# Patient Record
Sex: Female | Born: 1970 | Race: Black or African American | Hispanic: No | Marital: Single | Smoking: Never smoker
Health system: Southern US, Community
[De-identification: ages and names within clinical notes are randomized; demographics above are authoritative.]

## PROBLEM LIST (undated history)

## (undated) DIAGNOSIS — I10 Essential (primary) hypertension: Secondary | ICD-10-CM

## (undated) DIAGNOSIS — T8859XA Other complications of anesthesia, initial encounter: Secondary | ICD-10-CM

## (undated) DIAGNOSIS — N808 Other endometriosis: Secondary | ICD-10-CM

## (undated) DIAGNOSIS — T4145XA Adverse effect of unspecified anesthetic, initial encounter: Secondary | ICD-10-CM

## (undated) HISTORY — DX: Essential (primary) hypertension: I10

## (undated) HISTORY — PX: ABDOMINAL HYSTERECTOMY: SHX81

---

## 2010-06-30 ENCOUNTER — Inpatient Hospital Stay (INDEPENDENT_AMBULATORY_CARE_PROVIDER_SITE_OTHER)
Admission: RE | Admit: 2010-06-30 | Discharge: 2010-06-30 | Disposition: A | Discharge status: Home / Self Care | Payer: Self-pay | Source: Ambulatory Visit | Attending: Family Medicine | Admitting: Family Medicine

## 2010-06-30 DIAGNOSIS — R6889 Other general symptoms and signs: Secondary | ICD-10-CM

## 2010-06-30 DIAGNOSIS — N898 Other specified noninflammatory disorders of vagina: Secondary | ICD-10-CM

## 2010-06-30 DIAGNOSIS — M2180 Other specified acquired deformities of unspecified limb: Secondary | ICD-10-CM

## 2010-06-30 LAB — WET PREP, GENITAL: Trich, Wet Prep: NONE SEEN

## 2010-07-01 LAB — GC/CHLAMYDIA PROBE AMP, GENITAL
Chlamydia, DNA Probe: NEGATIVE
GC Probe Amp, Genital: NEGATIVE

## 2010-07-22 ENCOUNTER — Emergency Department (HOSPITAL_COMMUNITY)
Admission: EM | Admit: 2010-07-22 | Discharge: 2010-07-22 | Disposition: A | Discharge status: Home / Self Care | Payer: Medicaid Other | Attending: Emergency Medicine | Admitting: Emergency Medicine

## 2010-07-22 DIAGNOSIS — L723 Sebaceous cyst: Secondary | ICD-10-CM | POA: Insufficient documentation

## 2010-07-22 DIAGNOSIS — R1033 Periumbilical pain: Secondary | ICD-10-CM | POA: Insufficient documentation

## 2010-07-22 LAB — URINALYSIS, ROUTINE W REFLEX MICROSCOPIC
Glucose, UA: NEGATIVE mg/dL
Ketones, ur: NEGATIVE mg/dL
Leukocytes, UA: NEGATIVE
Nitrite: NEGATIVE
Protein, ur: NEGATIVE mg/dL
Urobilinogen, UA: 0.2 mg/dL (ref 0.0–1.0)

## 2010-07-22 LAB — BASIC METABOLIC PANEL
BUN: 12 mg/dL (ref 6–23)
CO2: 26 mEq/L (ref 19–32)
Chloride: 100 mEq/L (ref 96–112)
Creatinine, Ser: 0.53 mg/dL (ref 0.4–1.2)
Potassium: 3.4 mEq/L — ABNORMAL LOW (ref 3.5–5.1)

## 2010-07-22 LAB — CBC
Hemoglobin: 13.5 g/dL (ref 12.0–15.0)
MCH: 28 pg (ref 26.0–34.0)
MCHC: 35.2 g/dL (ref 30.0–36.0)
Platelets: 142 10*3/uL — ABNORMAL LOW (ref 150–400)
RDW: 14.1 % (ref 11.5–15.5)

## 2010-07-22 LAB — PREGNANCY, URINE: Preg Test, Ur: NEGATIVE

## 2010-07-22 LAB — URINE MICROSCOPIC-ADD ON

## 2010-09-07 ENCOUNTER — Emergency Department (HOSPITAL_COMMUNITY)
Admission: EM | Admit: 2010-09-07 | Discharge: 2010-09-07 | Disposition: A | Discharge status: Home / Self Care | Payer: Medicaid Other | Attending: Emergency Medicine | Admitting: Emergency Medicine

## 2010-09-07 ENCOUNTER — Encounter (HOSPITAL_COMMUNITY): Payer: Self-pay

## 2010-09-07 ENCOUNTER — Emergency Department (HOSPITAL_COMMUNITY): Payer: Medicaid Other

## 2010-09-07 ENCOUNTER — Inpatient Hospital Stay (INDEPENDENT_AMBULATORY_CARE_PROVIDER_SITE_OTHER)
Admission: RE | Admit: 2010-09-07 | Discharge: 2010-09-07 | Disposition: A | Discharge status: Home / Self Care | Payer: Self-pay | Source: Ambulatory Visit | Attending: Emergency Medicine | Admitting: Emergency Medicine

## 2010-09-07 DIAGNOSIS — R10819 Abdominal tenderness, unspecified site: Secondary | ICD-10-CM

## 2010-09-07 DIAGNOSIS — L02219 Cutaneous abscess of trunk, unspecified: Secondary | ICD-10-CM | POA: Insufficient documentation

## 2010-09-07 DIAGNOSIS — R1033 Periumbilical pain: Secondary | ICD-10-CM | POA: Insufficient documentation

## 2010-09-07 DIAGNOSIS — R109 Unspecified abdominal pain: Secondary | ICD-10-CM | POA: Insufficient documentation

## 2010-09-07 LAB — COMPREHENSIVE METABOLIC PANEL
CO2: 26 mEq/L (ref 19–32)
Calcium: 9.2 mg/dL (ref 8.4–10.5)
Creatinine, Ser: <0.47 mg/dL — ABNORMAL LOW (ref 0.50–1.10)
Glucose, Bld: 79 mg/dL (ref 70–99)
Total Protein: 8.1 g/dL (ref 6.0–8.3)

## 2010-09-07 LAB — POCT URINALYSIS DIP (DEVICE)
Glucose, UA: NEGATIVE mg/dL
Nitrite: NEGATIVE
Specific Gravity, Urine: 1.015 (ref 1.005–1.030)
Urobilinogen, UA: 0.2 mg/dL (ref 0.0–1.0)
pH: 7 (ref 5.0–8.0)

## 2010-09-07 LAB — DIFFERENTIAL
Eosinophils Absolute: 0.2 10*3/uL (ref 0.0–0.7)
Lymphs Abs: 2.7 10*3/uL (ref 0.7–4.0)
Monocytes Absolute: 0.5 10*3/uL (ref 0.1–1.0)
Monocytes Relative: 7 % (ref 3–12)
Neutro Abs: 3.9 10*3/uL (ref 1.7–7.7)
Neutrophils Relative %: 53 % (ref 43–77)

## 2010-09-07 LAB — CBC
Hemoglobin: 14.2 g/dL (ref 12.0–15.0)
MCH: 28.1 pg (ref 26.0–34.0)
MCHC: 35.6 g/dL (ref 30.0–36.0)
MCV: 78.9 fL (ref 78.0–100.0)
RBC: 5.06 MIL/uL (ref 3.87–5.11)

## 2010-09-07 LAB — LIPASE, BLOOD: Lipase: 49 U/L (ref 11–59)

## 2010-09-07 MED ORDER — IOHEXOL 300 MG/ML  SOLN
100.0000 mL | Freq: Once | INTRAMUSCULAR | Status: AC | PRN
  Administered 2010-09-07: 100 mL via INTRAVENOUS

## 2010-09-10 ENCOUNTER — Emergency Department (HOSPITAL_COMMUNITY)
Admission: EM | Admit: 2010-09-10 | Discharge: 2010-09-10 | Disposition: A | Discharge status: Home / Self Care | Payer: Medicaid Other | Attending: Emergency Medicine | Admitting: Emergency Medicine

## 2010-09-10 DIAGNOSIS — N83209 Unspecified ovarian cyst, unspecified side: Secondary | ICD-10-CM | POA: Insufficient documentation

## 2010-09-10 DIAGNOSIS — R1032 Left lower quadrant pain: Secondary | ICD-10-CM | POA: Insufficient documentation

## 2010-09-10 DIAGNOSIS — R509 Fever, unspecified: Secondary | ICD-10-CM | POA: Insufficient documentation

## 2010-09-10 DIAGNOSIS — Z09 Encounter for follow-up examination after completed treatment for conditions other than malignant neoplasm: Secondary | ICD-10-CM | POA: Insufficient documentation

## 2010-09-19 ENCOUNTER — Emergency Department (HOSPITAL_COMMUNITY)
Admission: EM | Admit: 2010-09-19 | Discharge: 2010-09-19 | Disposition: A | Discharge status: Home / Self Care | Payer: Medicaid Other | Attending: Emergency Medicine | Admitting: Emergency Medicine

## 2010-09-19 DIAGNOSIS — K429 Umbilical hernia without obstruction or gangrene: Secondary | ICD-10-CM | POA: Insufficient documentation

## 2010-09-19 DIAGNOSIS — R609 Edema, unspecified: Secondary | ICD-10-CM | POA: Insufficient documentation

## 2010-09-19 DIAGNOSIS — R229 Localized swelling, mass and lump, unspecified: Secondary | ICD-10-CM | POA: Insufficient documentation

## 2010-09-22 ENCOUNTER — Inpatient Hospital Stay (INDEPENDENT_AMBULATORY_CARE_PROVIDER_SITE_OTHER)
Admission: RE | Admit: 2010-09-22 | Discharge: 2010-09-22 | Disposition: A | Discharge status: Home / Self Care | Payer: Medicaid Other | Source: Ambulatory Visit | Attending: Family Medicine | Admitting: Family Medicine

## 2010-09-22 DIAGNOSIS — R10819 Abdominal tenderness, unspecified site: Secondary | ICD-10-CM

## 2010-10-21 ENCOUNTER — Encounter: Payer: Medicaid Other | Admitting: Obstetrics & Gynecology

## 2010-11-26 ENCOUNTER — Encounter: Payer: Medicaid Other | Admitting: Obstetrics and Gynecology

## 2011-01-08 ENCOUNTER — Encounter: Payer: Medicaid Other | Admitting: Advanced Practice Midwife

## 2011-01-08 ENCOUNTER — Ambulatory Visit (INDEPENDENT_AMBULATORY_CARE_PROVIDER_SITE_OTHER): Payer: Medicaid Other | Admitting: Obstetrics & Gynecology

## 2011-01-08 ENCOUNTER — Encounter: Payer: Self-pay | Admitting: Obstetrics & Gynecology

## 2011-01-08 ENCOUNTER — Encounter: Payer: Self-pay | Admitting: Advanced Practice Midwife

## 2011-01-08 VITALS — BP 145/101 | HR 83 | Temp 97.0°F | Ht 59.0 in | Wt 141.8 lb

## 2011-01-08 DIAGNOSIS — I10 Essential (primary) hypertension: Secondary | ICD-10-CM | POA: Insufficient documentation

## 2011-01-08 DIAGNOSIS — N9489 Other specified conditions associated with female genital organs and menstrual cycle: Secondary | ICD-10-CM

## 2011-01-08 DIAGNOSIS — N83209 Unspecified ovarian cyst, unspecified side: Secondary | ICD-10-CM | POA: Insufficient documentation

## 2011-01-08 LAB — CA 125: CA 125: 28.4 U/mL (ref 0.0–30.2)

## 2011-01-08 LAB — COMPREHENSIVE METABOLIC PANEL
AST: 19 U/L (ref 0–37)
Alkaline Phosphatase: 63 U/L (ref 39–117)
Glucose, Bld: 77 mg/dL (ref 70–99)
Sodium: 138 mEq/L (ref 135–145)
Total Bilirubin: 0.6 mg/dL (ref 0.3–1.2)
Total Protein: 7.7 g/dL (ref 6.0–8.3)

## 2011-01-08 LAB — CBC
MCH: 27.8 pg (ref 26.0–34.0)
MCHC: 34.8 g/dL (ref 30.0–36.0)
Platelets: 160 10*3/uL (ref 150–400)
RDW: 14 % (ref 11.5–15.5)

## 2011-01-08 LAB — LACTATE DEHYDROGENASE: LDH: 203 U/L (ref 94–250)

## 2011-01-08 MED ORDER — HYDROCHLOROTHIAZIDE 25 MG PO TABS: 25.0000 mg | ORAL_TABLET | Freq: Every day | ORAL | Status: DC

## 2011-01-08 NOTE — Progress Notes (Signed)
History:  40 y.o. G0P0 here today for follow up of adnexal cyst/pelvic mass seen during an ER encounter for abdominal pain in 08/2010. Patient is here today with a [place] interpreter.  She denies any current pain, bleeding or other GYN concerns.    09/07/10 CT ABDOMEN AND PELVIS WITH CONTRAST  Findings: The lung bases are clear. The solid abdominal organs are normal. The gallbladder is normal. No common bile duct dilatation. The stomach, duodenum, small bowel and colon are unremarkable. The appendix is normal. No mesenteric or retroperitoneal masses or adenopathy. The aorta is normal in caliber. Node dissection. The major branch vessels are normal. A circumaortic left renal vein is noted. There is a soft tissue mass at the umbilicus. This could be an umbilical region hernia. This should be obvious clinically. Recommend clinical correlation. The uterus is surgically absent. There is a large cystic lesion likely associated the left ovary. This measures 8 x 6 x 6.7 cm. The bladder is unremarkable. No pelvic lymphadenopathy. Small left inguinal lymph nodes are noted. The bony structures are unremarkable.  IMPRESSION: 1. 8 cm cystic adnexal lesion. MRI may be helpful for further evaluation. 2. Soft tissue mass at the dome like this is likely a small umbilical hernia.  The following portions of the patient's history were reviewed and updated as appropriate: allergies, current medications, past family history, past medical history, past social history, past surgical history and problem list.   Objective:  Physical Exam Blood pressure 145/101, pulse 83, temperature 97 F (36.1 C), temperature source Oral, height 4\' 11"  (1.499 m), weight 141 lb 12.8 oz (64.32 kg). Gen: NAD Abd: Soft, nontender and nondistended Pelvic: Normal appearing external genitalia.  No palpable masses or adnexal tenderness but patient had voluntary guarding making it difficult to palpate anything.  Assessment & Plan:  Will check ovarian  cancer markers and get MRI for further characterization.  Follow up results and manage accordingly. Analgesia as needed. Patient referred to [facility] for management of HTN; she was presumptively started on HCTZ 25 mg po daily.

## 2011-01-08 NOTE — Patient Instructions (Signed)
Come back to [facility] or call clinic for any concerns.

## 2011-01-08 NOTE — Progress Notes (Signed)
Referral sent to [facility].

## 2011-01-08 NOTE — Progress Notes (Signed)
Used interpreter [employer]

## 2011-01-13 ENCOUNTER — Ambulatory Visit (HOSPITAL_COMMUNITY)
Admission: RE | Admit: 2011-01-13 | Discharge: 2011-01-13 | Disposition: A | Discharge status: Home / Self Care | Payer: Medicaid Other | Source: Ambulatory Visit | Attending: Obstetrics & Gynecology | Admitting: Obstetrics & Gynecology

## 2011-01-13 DIAGNOSIS — N9489 Other specified conditions associated with female genital organs and menstrual cycle: Secondary | ICD-10-CM | POA: Insufficient documentation

## 2011-01-13 MED ORDER — GADOBENATE DIMEGLUMINE 529 MG/ML IV SOLN
13.0000 mL | Freq: Once | INTRAVENOUS | Status: AC
  Administered 2011-01-13: 13 mL via INTRAVENOUS

## 2011-01-18 ENCOUNTER — Telehealth: Payer: Self-pay | Admitting: *Deleted

## 2011-01-18 NOTE — Telephone Encounter (Signed)
Message copied by Jill Side on Mon Jan 18, 2011  4:20 PM ------      Message from: Mayra Neer P      Created: Wed Jan 13, 2011  4:38 PM      Regarding: Call patient with results       Patient speaks [place].  Please call on Monday 01/18/11 with MRI results.  Let her know that MRI shows shrinkage and no immediate need for surgery.  If she would like to schedule an appointment to discuss face-to-face with provider, please schedule.      Thanks,      [employer]

## 2011-01-18 NOTE — Telephone Encounter (Signed)
Contacted pt w/[employer] interpreter # [id number]. I informed her of MRI result and no need for surgery @ this time. I also gave information that repeat ultrasound is recommended to be performed in 1 yr. Pt voiced understanding. I stated that it was not necessary for pt to return to see the doctor but if she wanted, we would make an appt. Pt stated that she would like to see the doctor again.  Appt was given for 01/28/11 @ 1245.  Pt voiced understanding.

## 2011-01-28 ENCOUNTER — Encounter: Payer: Self-pay | Admitting: Obstetrics & Gynecology

## 2011-01-28 ENCOUNTER — Ambulatory Visit (INDEPENDENT_AMBULATORY_CARE_PROVIDER_SITE_OTHER): Payer: Medicaid Other | Admitting: Obstetrics & Gynecology

## 2011-01-28 DIAGNOSIS — K429 Umbilical hernia without obstruction or gangrene: Secondary | ICD-10-CM

## 2011-01-28 DIAGNOSIS — Z90710 Acquired absence of both cervix and uterus: Secondary | ICD-10-CM | POA: Insufficient documentation

## 2011-01-28 NOTE — Progress Notes (Signed)
Pt has already had flu vaccine from school.

## 2011-01-28 NOTE — Progress Notes (Signed)
History:  40 y.o. G0P0 here today for follow up of adnexal cyst/pelvic mass seen during an ER encounter for abdominal pain in 08/2010. Patient is here today with a [place] interpreter.  Patient underwent an MRI on 01/13/11 and is here for follow up results.   01/13/2011  MRI PELVIS WITHOUT AND WITH CONTRAST  Findings: The cystic left ovarian lesion has decreased in volume compared to prior measuring 3.6 x 4.0 x 3.9 cm compared to 8.1 x 5.9 x 7.1 cm.  The lesion has no significant nodularity or septal elements.  There is small amount of hemorrhage or protein which collects dependently within the lesion seen on the noncontrast T1- weighted imaging.  On the postcontrast images, the cystic lesion has no enhancing internal components.  There is a rim of tissues surrounding the lesion which is likely ovarian tissue.  There are several smaller cysts within the elongated left ovary which also appears simple and measure less than 10 mm.  There the right ovary is not clearly identified.  Bladder is normal.  Post hysterectomy anatomy.  No pelvic lymphadenopathy.  The rectum and sigmoid colon appear normal.  No osseous lesions evident.  IMPRESSION:   Interval decrease in size of left adnexal cystic lesion is a reassuring finding.  Lesion has no specific worrisome characteristics.  Due to its large size recommend follow-up pelvic ultrasound in 1 year.   09/07/10 CT ABDOMEN AND PELVIS WITH CONTRAST  Findings: The lung bases are clear. The solid abdominal organs are normal. The gallbladder is normal. No common bile duct dilatation. The stomach, duodenum, small bowel and colon are unremarkable. The appendix is normal. No mesenteric or retroperitoneal masses or adenopathy. The aorta is normal in caliber. Node dissection. The major branch vessels are normal. A circumaortic left renal vein is noted. There is a soft tissue mass at the umbilicus. This could be an umbilical region hernia. This should be obvious clinically. Recommend  clinical correlation. The uterus is surgically absent. There is a large cystic lesion likely associated the left ovary. This measures 8 x 6 x 6.7 cm. The bladder is unremarkable. No pelvic lymphadenopathy. Small left inguinal lymph nodes are noted. The bony structures are unremarkable.  IMPRESSION: 1. 8 cm cystic adnexal lesion. MRI may be helpful for further evaluation. 2. Soft tissue mass at the dome like this is likely a small umbilical hernia.  Patient was reassured by this results but then reported that she has episodes of vaginal bleeding every 1-2 months that lasts a couple of days.  She said this started a year after her hysterectomy.  She also wants to know if she can have children.  Also, she reports umbilical pain that has been on-and-off for many months with some irritation and bleeding.  No other symptoms.  The following portions of the patient's history were reviewed and updated as appropriate: allergies, current medications, past family history, past medical history, past social history, past surgical history and problem list.   Objective:  Physical Exam Blood pressure 115/81, pulse 88, temperature 96.8 F (36 C), temperature source Oral, height 5\' 2"  (1.575 m), weight 139 lb 9.6 oz (63.322 kg). Gen: NAD Abd: Soft, about 2 cm umbilical hernia noted with some tenderness on palpation, superficial excoriated lesion noted on surface, no active drainage, bleeding or erythema Pelvic: Normal appearing external genitalia. On speculum exam, no cervix visualized and no vaginal lesions seen.  Well healed vaginal cuff.  On bimanual exam, no palpable cervix, masses or adnexal tenderness.  Recent Results (from the past 672 hour(s))  CA 125   Collection Time   01/08/11  9:25 AM      Component Value Range   CA 125 28.4  0.0 - 30.2 (U/mL)  LACTATE DEHYDROGENASE   Collection Time   01/08/11  9:25 AM      Component Value Range   LD 203  94 - 250 (U/L)  HCG, TUMOR MARKER   Collection Time    01/08/11  9:25 AM      Component Value Range   Beta hCG, Tumor Marker < 0.5  < 5.0 (mIU/mL)  CBC   Collection Time   01/08/11  9:25 AM      Component Value Range   WBC 4.9  4.0 - 10.5 (K/uL)   RBC 4.93  3.87 - 5.11 (MIL/uL)   Hemoglobin 13.7  12.0 - 15.0 (g/dL)   HCT 40.9  81.1 - 91.4 (%)   MCV 79.9  78.0 - 100.0 (fL)   MCH 27.8  26.0 - 34.0 (pg)   MCHC 34.8  30.0 - 36.0 (g/dL)   RDW 78.2  95.6 - 21.3 (%)   Platelets 160  150 - 400 (K/uL)  COMPREHENSIVE METABOLIC PANEL   Collection Time   01/08/11  9:25 AM      Component Value Range   Sodium 138  135 - 145 (mEq/L)   Potassium 3.9  3.5 - 5.3 (mEq/L)   Chloride 103  96 - 112 (mEq/L)   CO2 23  19 - 32 (mEq/L)   Glucose, Bld 77  70 - 99 (mg/dL)   BUN 9  6 - 23 (mg/dL)   Creat 0.86  5.78 - 4.69 (mg/dL)   Total Bilirubin 0.6  0.3 - 1.2 (mg/dL)   Alkaline Phosphatase 63  39 - 117 (U/L)   AST 19  0 - 37 (U/L)   ALT 15  0 - 35 (U/L)   Total Protein 7.7  6.0 - 8.3 (g/dL)   Albumin 4.1  3.5 - 5.2 (g/dL)   Calcium 9.5  8.4 - 62.9 (mg/dL)  Ovarian cancer markers: All within normal limits   Assessment & Plan:  Patient reassured about adnexal mass, will reevaluate in one year or earlier  Patient informed that she does not have a cervix or uterus and no bleeding etiology is found.  She was also told she cannot bear any children; but can use a surrogate given that she still has her ovaries.  Patient reported that she was not informed that she could not have more children during her surgery in [place].  She was appropriately saddened by this news, support given.  Patient referred to General Surgery for management of her umbilical hernia.  Return to clinic for any gynecologic concerns or for annual exam.

## 2011-01-28 NOTE — Patient Instructions (Signed)
Return to clinic as needed

## 2011-01-28 NOTE — Progress Notes (Signed)
Referral made to [facility] for umbilical hernia fjor 02/12/11 at 0920 with Dr. Luisa Hart. Patient notified

## 2011-02-08 ENCOUNTER — Inpatient Hospital Stay (HOSPITAL_COMMUNITY): Payer: Medicaid Other

## 2011-02-08 ENCOUNTER — Encounter (HOSPITAL_COMMUNITY): Payer: Self-pay | Admitting: *Deleted

## 2011-02-08 ENCOUNTER — Inpatient Hospital Stay (HOSPITAL_COMMUNITY)
Admission: AD | Admit: 2011-02-08 | Discharge: 2011-02-08 | Disposition: A | Discharge status: Home / Self Care | Payer: Medicaid Other | Source: Ambulatory Visit | Attending: Obstetrics & Gynecology | Admitting: Obstetrics & Gynecology

## 2011-02-08 DIAGNOSIS — Z90710 Acquired absence of both cervix and uterus: Secondary | ICD-10-CM | POA: Insufficient documentation

## 2011-02-08 DIAGNOSIS — N83209 Unspecified ovarian cyst, unspecified side: Secondary | ICD-10-CM | POA: Insufficient documentation

## 2011-02-08 DIAGNOSIS — R109 Unspecified abdominal pain: Secondary | ICD-10-CM | POA: Insufficient documentation

## 2011-02-08 DIAGNOSIS — N949 Unspecified condition associated with female genital organs and menstrual cycle: Secondary | ICD-10-CM | POA: Insufficient documentation

## 2011-02-08 DIAGNOSIS — N898 Other specified noninflammatory disorders of vagina: Secondary | ICD-10-CM | POA: Insufficient documentation

## 2011-02-08 LAB — CBC
HCT: 37.6 % (ref 36.0–46.0)
Platelets: 153 10*3/uL (ref 150–400)
RBC: 4.65 MIL/uL (ref 3.87–5.11)
RDW: 13.9 % (ref 11.5–15.5)
WBC: 5.5 10*3/uL (ref 4.0–10.5)

## 2011-02-08 LAB — URINALYSIS, ROUTINE W REFLEX MICROSCOPIC
Bilirubin Urine: NEGATIVE
Glucose, UA: NEGATIVE mg/dL
Hgb urine dipstick: NEGATIVE
Ketones, ur: NEGATIVE mg/dL
Leukocytes, UA: NEGATIVE
Nitrite: NEGATIVE
Protein, ur: NEGATIVE mg/dL
Specific Gravity, Urine: 1.01 (ref 1.005–1.030)
Urobilinogen, UA: 0.2 mg/dL (ref 0.0–1.0)
pH: 7 (ref 5.0–8.0)

## 2011-02-08 LAB — DIFFERENTIAL
Basophils Absolute: 0 10*3/uL (ref 0.0–0.1)
Lymphocytes Relative: 39 % (ref 12–46)
Lymphs Abs: 2.2 10*3/uL (ref 0.7–4.0)
Neutro Abs: 2.7 10*3/uL (ref 1.7–7.7)

## 2011-02-08 LAB — WET PREP, GENITAL
Trich, Wet Prep: NONE SEEN
WBC, Wet Prep HPF POC: NONE SEEN
Yeast Wet Prep HPF POC: NONE SEEN

## 2011-02-08 MED ORDER — TRAMADOL-ACETAMINOPHEN 37.5-325 MG PO TABS
1.0000 | ORAL_TABLET | Freq: Four times a day (QID) | ORAL | Status: AC | PRN
Start: 2011-02-08 — End: 2011-02-18

## 2011-02-08 NOTE — ED Notes (Signed)
Dr. Debroah Loop at bedside discussing plan for pt to be reevaluated next month using language line.

## 2011-02-08 NOTE — ED Notes (Signed)
Pt to be seen in the GYN clinic at [facility] on 03-11-11 at 1445 for re evaluation.

## 2011-02-08 NOTE — Progress Notes (Signed)
Pt states suprapubic pain since Saturday, is constant, denies bleeding or vag d/c changes. Hysterectomy 5 years ago. Denies uti s/s.

## 2011-02-08 NOTE — ED Provider Notes (Signed)
History     CSN: [id number] Arrival date & time: 02/08/2011  9:32 AM   None     Chief Complaint  Patient presents with  . Abdominal Pain   HPI Anne [name] is a 40 y.o. female who presents to [facility] for abdominal pain and vaginal bleeding that started 2 days ago. She had a hysterectomy in 2008 but still has her ovaries. She had a mass on her left ovary that has been followed by the GYN Clinic. Had MRI a couple weeks ago. The history was provided by the patient through the [employer].  Past Medical History  Diagnosis Date  . H/O: hysterectomy   . Hypertension 01/08/2011    Past Surgical History  Procedure Date  . Abdominal hysterectomy   . Hernia repair     Family History  Problem Relation Age of Onset  . Anesthesia problems Neg Hx     History  Substance Use Topics  . Smoking status: Never Smoker   . Smokeless tobacco: Never Used  . Alcohol Use: No    OB History    Grav Para Term Preterm Abortions TAB SAB Ect Mult Living   0 0 0 0 0 0 0 0 0 0       Review of Systems  Constitutional: Negative for fever and chills.  HENT: Negative.   Eyes: Negative.   Respiratory: Negative.   Gastrointestinal: Positive for abdominal pain and diarrhea. Negative for nausea, vomiting and constipation.  Genitourinary: Positive for vaginal bleeding and pelvic pain. Negative for dysuria, frequency and vaginal discharge.  Musculoskeletal: Negative for back pain.  Skin: Negative.   Neurological: Negative for dizziness, light-headedness and headaches.  Psychiatric/Behavioral: The patient is not nervous/anxious.     Allergies  Review of patient's allergies indicates no known allergies.  Home Medications  No current outpatient prescriptions on file.  BP 136/88  Pulse 71  Temp(Src) 96.7 F (35.9 C) (Oral)  Resp 16  Ht 4' 11.25" (1.505 m)  Wt 141 lb 8 oz (64.184 kg)  BMI 28.34 kg/m2  Physical Exam  Nursing note and vitals reviewed. Constitutional: She is oriented to  person, place, and time. She appears well-developed and well-nourished.  HENT:  Head: Normocephalic.  Eyes: EOM are normal.  Neck: Neck supple.  Cardiovascular: Normal rate.   Pulmonary/Chest: Effort normal.  Abdominal: Soft. There is tenderness.       Abdominal scars due to hysterectomy and hernia repair. Tender with palpation lower abdomen, left side > right.   Genitourinary:       External genitalia without lesions. Scant blood vaginal vault. Cervix absent, uterus absent. Left adnexal tenderness.  Musculoskeletal:       Left leg much smaller than the right. Patient states due to having a sore there when she was a child that affected the growth of the leg.  Neurological: She is alert and oriented to person, place, and time. No cranial nerve deficit.  Skin: Skin is warm and dry.  Psychiatric: Her behavior is normal. Judgment and thought content normal.   Results for orders placed during the hospital encounter of 02/08/11 (from the past 24 hour(s))  URINALYSIS, ROUTINE W REFLEX MICROSCOPIC     Status: Normal   Collection Time   02/08/11 10:27 AM      Component Value Range   Color, Urine YELLOW  YELLOW    APPearance CLEAR  CLEAR    Specific Gravity, Urine 1.010  1.005 - 1.030    pH 7.0  5.0 - 8.0  Glucose, UA NEGATIVE  NEGATIVE (mg/dL)   Hgb urine dipstick NEGATIVE  NEGATIVE    Bilirubin Urine NEGATIVE  NEGATIVE    Ketones, ur NEGATIVE  NEGATIVE (mg/dL)   Protein, ur NEGATIVE  NEGATIVE (mg/dL)   Urobilinogen, UA 0.2  0.0 - 1.0 (mg/dL)   Nitrite NEGATIVE  NEGATIVE    Leukocytes, UA NEGATIVE  NEGATIVE   WET PREP, GENITAL     Status: Normal   Collection Time   02/08/11  1:20 PM      Component Value Range   Yeast, Wet Prep NONE SEEN  NONE SEEN    Trich, Wet Prep NONE SEEN  NONE SEEN    Clue Cells, Wet Prep NONE SEEN  NONE SEEN    WBC, Wet Prep HPF POC NONE SEEN  NONE SEEN    ED Course  ProceduresUs Transvaginal Non-ob  02/08/2011  *RADIOLOGY REPORT*  Clinical Data: History  of left adnexal mass.  Post hysterectomy 5 years ago.  TRANSABDOMINAL AND TRANSVAGINAL ULTRASOUND OF PELVIS Technique:  Both transabdominal and transvaginal ultrasound examinations of the pelvis were performed. Transabdominal technique was performed for global imaging of the pelvis including uterus, ovaries, adnexal regions, and pelvic cul-de-sac.  Comparison: MRI 12/2010   It was necessary to proceed with endovaginal exam following the transabdominal exam to visualize the vaginal cuff and adnexa.  Findings:  Uterus: Has been surgically removed.  A normal vaginal cuff is seen  Endometrium: Not applicable  Right ovary:  Is not seen with confidence either transabdominally or endovaginally  Left ovary: Measures 5.6 x 2.3 by 2.5 cm and contains several follicles, the largest measuring 1.7 x 1.1 by 1.8 cm. The previously noted cystic lesion associated with the left ovary is no longer visualized.  Other findings: A trace of simple free fluid is noted in the cul-de- sac.  IMPRESSION: Interval resolution of the left ovarian cystic lesion seen by MRI. No worrisome ovarian lesions are identified today.  Normal vaginal cuff.  Original Report Authenticated By: Bertha Stakes, M.D.   US Pelvis Complete  02/08/2011  *RADIOLOGY REPORT*  Clinical Data: History of left adnexal mass.  Post hysterectomy 5 years ago.  TRANSABDOMINAL AND TRANSVAGINAL ULTRASOUND OF PELVIS Technique:  Both transabdominal and transvaginal ultrasound examinations of the pelvis were performed. Transabdominal technique was performed for global imaging of the pelvis including uterus, ovaries, adnexal regions, and pelvic cul-de-sac.  Comparison: MRI 12/2010   It was necessary to proceed with endovaginal exam following the transabdominal exam to visualize the vaginal cuff and adnexa.  Findings:  Uterus: Has been surgically removed.  A normal vaginal cuff is seen  Endometrium: Not applicable  Right ovary:  Is not seen with confidence either  transabdominally or endovaginally  Left ovary: Measures 5.6 x 2.3 by 2.5 cm and contains several follicles, the largest measuring 1.7 x 1.1 by 1.8 cm. The previously noted cystic lesion associated with the left ovary is no longer visualized.  Other findings: A trace of simple free fluid is noted in the cul-de- sac.  IMPRESSION: Interval resolution of the left ovarian cystic lesion seen by MRI. No worrisome ovarian lesions are identified today.  Normal vaginal cuff.  Original Report Authenticated By: Bertha Stakes, M.D.   Assessment: Vaginal bleeding s/p hysterectomy 2008   Pelvic pain   Left ovarian cyst Findings reviewed with patient via interpreter. Will repeat U/S 1 month, Rx Ultracet prn, rtc 1 mo. Plan:  Dr. Debroah Loop in to evaluate the patient.   Discussed results of  ultrasound and follow up plan of care.  ARNOLD,JAMES    MDM          Kerrie Buffalo, NP 02/08/11 1414

## 2011-02-08 NOTE — Plan of Care (Signed)
Interpreter language line ID # [id number] Dr. Debroah Loop had to step out of room, pt states she has dark discharge since 2009 every 2 months since hysterectomy. Will continue when Dr. Debroah Loop back in room

## 2011-02-12 ENCOUNTER — Encounter (INDEPENDENT_AMBULATORY_CARE_PROVIDER_SITE_OTHER): Payer: Self-pay | Admitting: Surgery

## 2011-02-12 ENCOUNTER — Ambulatory Visit (INDEPENDENT_AMBULATORY_CARE_PROVIDER_SITE_OTHER): Payer: Medicaid Other | Admitting: Surgery

## 2011-02-12 VITALS — BP 120/88 | HR 75 | Temp 96.9°F | Resp 18 | Ht 62.0 in | Wt 143.8 lb

## 2011-02-12 DIAGNOSIS — R1900 Intra-abdominal and pelvic swelling, mass and lump, unspecified site: Secondary | ICD-10-CM

## 2011-02-12 NOTE — Progress Notes (Signed)
Patient ID: Anne [name], female   DOB: 1970-03-19, 39 y.o.   MRN: [id number]  Chief Complaint  Patient presents with  . Umbilical Hernia    HPI Anne [name] is a 40 y.o. female. HPIThe patient presents today at the request of Dr. Sharlotte Alamo do to a mass at her umbilicus. It has been present for 9 months. It is not cause pain. [place] has [place] here from [place]. She has an ovarian cyst which is being followed. The mass is not bleed. She speaks little [place] and the remainder of her history is difficult.  Past Medical History  Diagnosis Date  . H/O: hysterectomy   . Hypertension 01/08/2011    Past Surgical History  Procedure Date  . Abdominal hysterectomy   . Hernia repair     Family History  Problem Relation Age of Onset  . Anesthesia problems Neg Hx     Social History History  Substance Use Topics  . Smoking status: Never Smoker   . Smokeless tobacco: Never Used  . Alcohol Use: No    No Known Allergies  Current Outpatient Prescriptions  Medication Sig Dispense Refill  . ibuprofen (ADVIL,MOTRIN) 200 MG tablet Take 400 mg by mouth 2 (two) times daily as needed. Pain        . traMADol-acetaminophen (ULTRACET) 37.5-325 MG per tablet Take 1-2 tablets by mouth every 6 (six) hours as needed for pain.  30 tablet  0    Review of Systems Review of Systems  Constitutional: Negative for fever, chills and unexpected weight change.  HENT: Negative for hearing loss, congestion, sore throat, trouble swallowing and voice change.   Eyes: Negative for visual disturbance.  Respiratory: Negative for cough and wheezing.   Cardiovascular: Negative for chest pain, palpitations and leg swelling.  Gastrointestinal: Negative for nausea, vomiting, abdominal pain, diarrhea, constipation, blood in stool, abdominal distention and anal bleeding.  Genitourinary: Negative for hematuria, vaginal bleeding and difficulty urinating.  Musculoskeletal: Negative for arthralgias.  Skin: Negative for rash  and wound.  Neurological: Negative for seizures, syncope and headaches.  Hematological: Negative for adenopathy. Does not bruise/bleed easily.  Psychiatric/Behavioral: Negative for confusion.    Blood pressure 120/88, pulse 75, temperature 96.9 F (36.1 C), temperature source Temporal, resp. rate 18, height 5\' 2"  (1.575 m), weight 143 lb 12.8 oz (65.227 kg).  Physical Exam Physical Exam  Constitutional: She appears well-developed and well-nourished.  HENT:  Head: Normocephalic and atraumatic.  Eyes: EOM are normal. Pupils are equal, round, and reactive to light.  Neck: Normal range of motion. Neck supple.  Cardiovascular: Normal rate and regular rhythm.   Pulmonary/Chest: Effort normal and breath sounds normal.  Abdominal: Soft.       Umbilical mass measuring 2 cm. No obvious fascial defect.  Skin: Skin is warm.    Data Reviewed CT from July 2012 reviewed mass umbilicus noted. 2 cm in size. Cystic mass of the ovary noted  Assessment    Umbilical mass versus umbilical hernia    Plan    I discussed operative therapies with her today. I do not think she understands what this entails. I will have her return when appropriate language support is available.       Kevron Patella A. 02/12/2011, 10:05 AM

## 2011-02-12 NOTE — Patient Instructions (Signed)
Return in 1 month

## 2011-03-11 ENCOUNTER — Ambulatory Visit: Payer: Medicaid Other | Admitting: Obstetrics & Gynecology

## 2011-03-18 ENCOUNTER — Encounter (INDEPENDENT_AMBULATORY_CARE_PROVIDER_SITE_OTHER): Payer: Self-pay | Admitting: Surgery

## 2011-03-18 ENCOUNTER — Ambulatory Visit (INDEPENDENT_AMBULATORY_CARE_PROVIDER_SITE_OTHER): Payer: Self-pay | Admitting: Surgery

## 2011-03-18 VITALS — BP 126/80 | HR 90 | Temp 98.4°F | Ht 59.84 in | Wt 144.0 lb

## 2011-03-18 DIAGNOSIS — K429 Umbilical hernia without obstruction or gangrene: Secondary | ICD-10-CM

## 2011-03-18 NOTE — Patient Instructions (Signed)
You will be scheduled for surgery.

## 2011-03-18 NOTE — Progress Notes (Signed)
Patient ID: Anne [name], female   DOB: [birth year], 41 y.o.   MRN: [id number]  Chief Complaint  Patient presents with  . Follow-up    reck umb hernia    HPI Anne [name] is a 41 y.o. female. HPIThe patient presents today at the request of Dr. Sharlotte Alamo do to a mass at her umbilicus. It has been present for 9 months. It is not cause pain. [place] has [place] from [place]. She has an ovarian cyst which is being followed. The mass is not bleed. She speaks little [place] and the remainder of her history is difficult. She was seen about a month ago and her symptoms are the same.  Past Medical History  Diagnosis Date  . H/O: hysterectomy   . Hypertension 01/08/2011    Past Surgical History  Procedure Date  . Abdominal hysterectomy   . Hernia repair     Family History  Problem Relation Age of Onset  . Anesthesia problems Neg Hx     Social History History  Substance Use Topics  . Smoking status: Never Smoker   . Smokeless tobacco: Never Used  . Alcohol Use: No    No Known Allergies  Current Outpatient Prescriptions  Medication Sig Dispense Refill  . ibuprofen (ADVIL,MOTRIN) 200 MG tablet Take 200 mg by mouth as needed.        Review of Systems Review of Systems  Constitutional: Negative for fever, chills and unexpected weight change.  HENT: Negative for hearing loss, congestion, sore throat, trouble swallowing and voice change.   Eyes: Negative for visual disturbance.  Respiratory: Negative for cough and wheezing.   Cardiovascular: Negative for chest pain, palpitations and leg swelling.  Gastrointestinal: Negative for nausea, vomiting, abdominal pain, diarrhea, constipation, blood in stool, abdominal distention and anal bleeding.  Genitourinary: Negative for hematuria, vaginal bleeding and difficulty urinating.  Musculoskeletal: Negative for arthralgias.  Skin: Negative for rash and wound.  Neurological: Negative for seizures, syncope and headaches.  Hematological:  Negative for adenopathy. Does not bruise/bleed easily.  Psychiatric/Behavioral: Negative for confusion.    Blood pressure 126/80, pulse 90, temperature 98.4 F (36.9 C), temperature source Temporal, height 4' 11.84" (1.52 m), weight 144 lb (65.318 kg), SpO2 98.00%.  Physical Exam Physical Exam  Constitutional: She appears well-developed and well-nourished.  HENT:  Head: Normocephalic and atraumatic.  Eyes: EOM are normal. Pupils are equal, round, and reactive to light.  Neck: Normal range of motion. Neck supple.  Cardiovascular: Normal rate and regular rhythm.   Pulmonary/Chest: Effort normal and breath sounds normal.  Abdominal: Soft.       Umbilical mass measuring 2 cm. No obvious fascial defect.  Skin: Skin is warm.    Data Reviewed CT from July 2012 reviewed mass umbilicus noted. 2 cm in size. Cystic mass of the ovary noted  Assessment    Umbilical mass versus umbilical hernia    Plan    She returns and I have a translator today explain the surgery,  Risks and alternative therapies.  She would like to proceed.The risk of hernia repair include bleeding,  Infection,   Recurrence of the hernia,  Mesh use, chronic pain,  Organ injury,  Bowel injury,  Bladder injury,   nerve injury with numbness around the incision,  Death,  and worsening of preexisting  medical problems.  The alternatives to surgery have been discussed as well..  Long term expectations of both operative and non operative treatments have been discussed.   The patient agrees to proceed.  Rosio Weiss A. 03/18/2011, 11:37 AM

## 2011-09-02 ENCOUNTER — Encounter (HOSPITAL_COMMUNITY): Payer: Self-pay | Admitting: Nurse Practitioner

## 2011-09-02 ENCOUNTER — Emergency Department (HOSPITAL_COMMUNITY)
Admission: EM | Admit: 2011-09-02 | Discharge: 2011-09-02 | Disposition: A | Discharge status: Home / Self Care | Payer: Self-pay | Attending: Emergency Medicine | Admitting: Emergency Medicine

## 2011-09-02 ENCOUNTER — Emergency Department (HOSPITAL_COMMUNITY): Payer: Self-pay

## 2011-09-02 DIAGNOSIS — L02211 Cutaneous abscess of abdominal wall: Secondary | ICD-10-CM

## 2011-09-02 DIAGNOSIS — L02219 Cutaneous abscess of trunk, unspecified: Secondary | ICD-10-CM

## 2011-09-02 DIAGNOSIS — L03319 Cellulitis of trunk, unspecified: Secondary | ICD-10-CM

## 2011-09-02 DIAGNOSIS — I10 Essential (primary) hypertension: Secondary | ICD-10-CM | POA: Insufficient documentation

## 2011-09-02 DIAGNOSIS — R1033 Periumbilical pain: Secondary | ICD-10-CM | POA: Insufficient documentation

## 2011-09-02 DIAGNOSIS — L0291 Cutaneous abscess, unspecified: Secondary | ICD-10-CM

## 2011-09-02 HISTORY — PX: INCISION AND DRAINAGE ABSCESS: SHX5864

## 2011-09-02 LAB — CBC WITH DIFFERENTIAL/PLATELET
HCT: 38.8 % (ref 36.0–46.0)
Hemoglobin: 13.6 g/dL (ref 12.0–15.0)
Lymphocytes Relative: 36 % (ref 12–46)
Lymphs Abs: 1.7 10*3/uL (ref 0.7–4.0)
MCV: 80.5 fL (ref 78.0–100.0)
Monocytes Absolute: 0.4 10*3/uL (ref 0.1–1.0)
Monocytes Relative: 8 % (ref 3–12)
Neutro Abs: 2.3 10*3/uL (ref 1.7–7.7)
WBC: 4.7 10*3/uL (ref 4.0–10.5)

## 2011-09-02 LAB — BASIC METABOLIC PANEL
BUN: 9 mg/dL (ref 6–23)
CO2: 25 mEq/L (ref 19–32)
Chloride: 105 mEq/L (ref 96–112)
Creatinine, Ser: 0.61 mg/dL (ref 0.50–1.10)
Glucose, Bld: 84 mg/dL (ref 70–99)

## 2011-09-02 LAB — POCT PREGNANCY, URINE: Preg Test, Ur: NEGATIVE

## 2011-09-02 LAB — LACTIC ACID, PLASMA: Lactic Acid, Venous: 1 mmol/L (ref 0.5–2.2)

## 2011-09-02 LAB — APTT: aPTT: 30 seconds (ref 24–37)

## 2011-09-02 MED ORDER — IOHEXOL 300 MG/ML  SOLN
80.0000 mL | Freq: Once | INTRAMUSCULAR | Status: AC | PRN
  Administered 2011-09-02: 80 mL via INTRAVENOUS

## 2011-09-02 MED ORDER — IOHEXOL 300 MG/ML  SOLN
20.0000 mL | INTRAMUSCULAR | Status: AC
Start: 2011-09-02 — End: 2011-09-02
  Administered 2011-09-02 (×2): 20 mL via ORAL

## 2011-09-02 MED ORDER — CLINDAMYCIN PHOSPHATE 600 MG/50ML IV SOLN
600.0000 mg | Freq: Once | INTRAVENOUS | Status: AC
  Administered 2011-09-02: 600 mg via INTRAVENOUS
  Filled 2011-09-02: qty 50

## 2011-09-02 MED ORDER — DOXYCYCLINE HYCLATE 100 MG PO CAPS: 100.0000 mg | ORAL_CAPSULE | Freq: Two times a day (BID) | ORAL | Status: AC

## 2011-09-02 MED ORDER — CEFAZOLIN SODIUM 1-5 GM-% IV SOLN
1.0000 g | Freq: Once | INTRAVENOUS | Status: AC
  Administered 2011-09-02: 1 g via INTRAVENOUS
  Filled 2011-09-02: qty 50

## 2011-09-02 MED ORDER — FENTANYL CITRATE 0.05 MG/ML IJ SOLN: 50.0000 ug | Freq: Once | INTRAMUSCULAR | Status: DC

## 2011-09-02 NOTE — Consult Note (Signed)
I have seen and examined the patient and agree with the assessment and plans.  Koran Seabrook A. Colm Lyford  MD, FACS  

## 2011-09-02 NOTE — Op Note (Signed)
[name], Anne [name]             ACCOUNT NO.:  [id number]  MEDICAL RECORD NO.:  [id number]  LOCATION:  CD08C                        FACILITY:  [facility]  PHYSICIAN:  Abigail Miyamoto, M.D. DATE OF BIRTH:  [birth year]  DATE OF PROCEDURE: DATE OF DISCHARGE:  09/02/2011                              OPERATIVE REPORT   PREOPERATIVE DIAGNOSIS:  Infected umbilical cyst.  POSTOPERATIVE DIAGNOSIS:  Infected umbilical cyst.  PROCEDURE:  Incision and drainage of infected umbilical cyst.  SURGEONS:  Abigail Miyamoto, MD  ANESTHESIA:  Lidocaine 1% with epinephrine.  ESTIMATED BLOOD LOSS:  Minimal.  INDICATIONS:  This is a 41 year old female who has had a chronic thickening of the umbilical skin.  She now presents with a draining area at the umbilicus.  CAT scan has confirmed what appears to be a subcutaneous abscess.  FINDINGS:  The patient was indeed found to have a subcutaneous abscess. There was no evidence of umbilical hernia.  PROCEDURE IN DETAIL:  The patient was in the emergency department. Consent was obtained.  Her umbilicus was prepped and draped in usual sterile fashion.  I anesthetized the skin with 1% lidocaine.  There were already 2 draining areas.  I connected these 2 areas with an 11 blade scalpel.  I then drained all the dark fluid from the cyst.  I probed the wound and found no communication with the peritoneal cavity.  This was all superficial.  The rest of the umbilicus appeared consistent with a keloid.  I then packed the wound with wet-to-dry gauze.  Dry gauze was then placed over top of this.  The patient tolerated the procedure well. Wound care instructions were given, and she will be discharged from the emergency department.     Abigail Miyamoto, M.D.     DB/MEDQ  D:  09/02/2011  T:  09/02/2011  Job:  [id number]

## 2011-09-02 NOTE — ED Notes (Signed)
SURGERY AT BEDSIDE TO I &D UMBILICAL ABCESS

## 2011-09-02 NOTE — ED Provider Notes (Signed)
Medical screening examination/treatment/procedure(s) were performed by non-physician practitioner and as supervising physician I was immediately available for consultation/collaboration.  Derwood Kaplan, MD 09/02/11 1949

## 2011-09-02 NOTE — ED Provider Notes (Signed)
History     CSN: [id number]  Arrival date & time 09/02/11  0848   First MD Initiated Contact with Patient 09/02/11 0858      9:28 AM HPI Reports for several years has had an umbilical hernia. Reports approximately 2 weeks ago hernia became firm and tender. Reports pain is worsened in the last 2 days and she is now here in the emergency department. Denies nausea, vomiting, fever, urinary symptoms, vaginal discharge or vaginal bleeding  Patient is a 41 y.o. female presenting with abdominal pain. The history is provided by the patient.  Abdominal Pain The primary symptoms of the illness include abdominal pain. The primary symptoms of the illness do not include fever, shortness of breath, nausea, vomiting, diarrhea, dysuria or vaginal discharge. Episode onset: >1 year ago. The onset of the illness was gradual. The problem has been rapidly worsening.  The patient states that she believes she is currently not pregnant. Symptoms associated with the illness do not include chills, heartburn, constipation, urgency, hematuria, frequency or back pain.    Past Medical History  Diagnosis Date  . H/O: hysterectomy   . Hypertension 01/08/2011  . Hernia, umbilical     Past Surgical History  Procedure Date  . Abdominal hysterectomy   . Hernia repair     Family History  Problem Relation Age of Onset  . Anesthesia problems Neg Hx     History  Substance Use Topics  . Smoking status: Never Smoker   . Smokeless tobacco: Never Used  . Alcohol Use: No    OB History    Grav Para Term Preterm Abortions TAB SAB Ect Mult Living   0 0 0 0 0 0 0 0 0 0       Review of Systems  Constitutional: Negative for fever and chills.  Respiratory: Negative for shortness of breath.   Cardiovascular: Negative for chest pain.  Gastrointestinal: Positive for abdominal pain. Negative for heartburn, nausea, vomiting, diarrhea and constipation.  Genitourinary: Negative for dysuria, urgency, frequency, hematuria,  flank pain, vaginal discharge and vaginal pain.  Musculoskeletal: Negative for back pain.  All other systems reviewed and are negative.    Allergies  Review of patient's allergies indicates no known allergies.  Home Medications  No current outpatient prescriptions on file.  BP 124/81  Pulse 88  Temp 97.7 F (36.5 C) (Oral)  Resp 16  SpO2 98%  Physical Exam  Vitals reviewed. Constitutional: She is oriented to person, place, and time. Vital signs are normal. She appears well-developed and well-nourished.  HENT:  Head: Normocephalic and atraumatic.  Eyes: Conjunctivae are normal. Pupils are equal, round, and reactive to light.  Neck: Normal range of motion. Neck supple.  Cardiovascular: Normal rate, regular rhythm and normal heart sounds.  Exam reveals no friction rub.   No murmur heard. Pulmonary/Chest: Effort normal and breath sounds normal. She has no wheezes. She has no rhonchi. She has no rales. She exhibits no tenderness.  Abdominal: Soft. Bowel sounds are normal. There is tenderness in the periumbilical area. There is no rigidity and no guarding.    Musculoskeletal: Normal range of motion.  Neurological: She is alert and oriented to person, place, and time. Coordination normal.  Skin: Skin is warm and dry. No rash noted. No erythema. No pallor.    ED Course  Procedures   Results for orders placed during the hospital encounter of 09/02/11  CBC WITH DIFFERENTIAL      Component Value Range   WBC 4.7  4.0 -  10.5 K/uL   RBC 4.82  3.87 - 5.11 MIL/uL   Hemoglobin 13.6  12.0 - 15.0 g/dL   HCT 56.2  13.0 - 86.5 %   MCV 80.5  78.0 - 100.0 fL   MCH 28.2  26.0 - 34.0 pg   MCHC 35.1  30.0 - 36.0 g/dL   RDW 78.4  69.6 - 29.5 %   Platelets 156  150 - 400 K/uL   Neutrophils Relative 50  43 - 77 %   Neutro Abs 2.3  1.7 - 7.7 K/uL   Lymphocytes Relative 36  12 - 46 %   Lymphs Abs 1.7  0.7 - 4.0 K/uL   Monocytes Relative 8  3 - 12 %   Monocytes Absolute 0.4  0.1 - 1.0 K/uL     Eosinophils Relative 5  0 - 5 %   Eosinophils Absolute 0.3  0.0 - 0.7 K/uL   Basophils Relative 0  0 - 1 %   Basophils Absolute 0.0  0.0 - 0.1 K/uL  BASIC METABOLIC PANEL      Component Value Range   Sodium 140  135 - 145 mEq/L   Potassium 3.3 (*) 3.5 - 5.1 mEq/L   Chloride 105  96 - 112 mEq/L   CO2 25  19 - 32 mEq/L   Glucose, Bld 84  70 - 99 mg/dL   BUN 9  6 - 23 mg/dL   Creatinine, Ser 2.84  0.50 - 1.10 mg/dL   Calcium 9.1  8.4 - 13.2 mg/dL   GFR calc non Af Amer >90  >90 mL/min   GFR calc Af Amer >90  >90 mL/min  LACTIC ACID, PLASMA      Component Value Range   Lactic Acid, Venous 1.0  0.5 - 2.2 mmol/L  PROTIME-INR      Component Value Range   Prothrombin Time 14.3  11.6 - 15.2 seconds   INR 1.09  0.00 - 1.49  APTT      Component Value Range   aPTT 30  24 - 37 seconds    MDM   Since the CDU pending CT scan and labs. Suspect incarcerated hernia. Regardless the CT scan is positive or negative hernia is not reducible and patient should have a surgery consult. Discussed plan with Dr. Lavinia Sharps, PA-C 09/02/11 1134

## 2011-09-02 NOTE — ED Provider Notes (Signed)
History     CSN: [id number]  Arrival date & time 09/02/11  0848   First MD Initiated Contact with Patient 09/02/11 [phone]      Chief Complaint  Patient presents with  . Abdominal Pain    (Consider location/radiation/quality/duration/timing/severity/associated sxs/prior treatment) HPI  Past Medical History  Diagnosis Date  . H/O: hysterectomy   . Hypertension 01/08/2011  . Hernia, umbilical     Past Surgical History  Procedure Date  . Abdominal hysterectomy   . Hernia repair     Family History  Problem Relation Age of Onset  . Anesthesia problems Neg Hx     History  Substance Use Topics  . Smoking status: Never Smoker   . Smokeless tobacco: Never Used  . Alcohol Use: No    OB History    Grav Para Term Preterm Abortions TAB SAB Ect Mult Living   0 0 0 0 0 0 0 0 0 0       Review of Systems  Allergies  Review of patient's allergies indicates no known allergies.  Home Medications  No current outpatient prescriptions on file.  BP 141/90  Pulse 73  Temp 98.3 F (36.8 C) (Oral)  Resp 18  SpO2 95%  Physical Exam  ED Course  Procedures (including critical care time)  Labs Reviewed  BASIC METABOLIC PANEL - Abnormal; Notable for the following:    Potassium 3.3 (*)     All other components within normal limits  CBC WITH DIFFERENTIAL  LACTIC ACID, PLASMA  PROTIME-INR  APTT   No results found.   No diagnosis found.    MDM  11:28 AM Pt with no medical hx comes in with abd pain. Pt has know hx of umbilical hernia. She states that she has been having pain for 2 weeks, worst since last night. Pt has no rebound, but + Guarding and there is a 3-4 cm, hernia that i was unable to reduce with firm pressure. There is some skin hyperpigmentation, but no edema, no erythema. Concerns for incarcerated hernia vs. Abscess vs cellulitis. CT ordered. Likely will need surgery to evaluate.    Thomasene Lot, PA-C 09/02/11 1135

## 2011-09-02 NOTE — ED Notes (Signed)
Pt. States she does not want anything for pain right now, but will inform this nurse when she does.

## 2011-09-02 NOTE — ED Provider Notes (Signed)
Medical screening examination/treatment/procedure(s) were conducted as a shared visit with non-physician practitioner(s) and myself.  I personally evaluated the patient during the encounter  Derwood Kaplan, MD 09/02/11 1948

## 2011-09-02 NOTE — ED Notes (Signed)
Pt reports pain around umbilical hernia x 2 weeks.

## 2011-09-02 NOTE — ED Provider Notes (Signed)
Medical screening examination/treatment/procedure(s) were conducted as a shared visit with non-physician practitioner(s) and myself.  I personally evaluated the patient during the encounter  Derwood Kaplan, MD 09/02/11 1949

## 2011-09-02 NOTE — ED Notes (Signed)
Patient is resting comfortably. 

## 2011-09-02 NOTE — ED Provider Notes (Signed)
1:23 PM Pt with known hx umbilical hernia, unable to be reduced in ED today, was moved to CDU to await completion of CT abd/pelvis with contrast for eval of ?incarceration. CT scan has resulted, images are reviewed, demonstrates abscess with cellulitis- reading today not consistent with prior reading of umbilical hernia 1 year ago. Dr Rhunette Croft is consulting general surgery to request their evaluation. On exam, pt A&O, NAD. Lungs CTAB. Heart RRR. Abd soft, normal bowel sounds. Appearance of small hernia with central fluctuance and firmness surrounding for 1cm in all directions. TTP. No erythema seen. Pt is updated on delay and understands plan, will continue to monitor.  3:41 PM [facility] saw pt in ED, drained umbilical abscess. Reported no hernia in abdominal wall. Feel pt ok for d/c home with abx, f/u in their clinic. They will call her to set up appointment. They have also rx pain medication. Pt has received both clindamycin and ancef in ED, ready for d/c home.  Shaaron Adler, [place] 09/02/11 1542

## 2011-09-02 NOTE — ED Notes (Signed)
Ct called and made aware pt has finished her ct prep

## 2011-09-02 NOTE — Consult Note (Signed)
Reason for Consult:abdominal pain Referring Physician: Dr. [name] is an 41 y.o. female.  HPI: Reports for several years has had an umbilical hernia. Reports approximately 2 weeks ago hernia became firm and tender. Reports pain is worsened in the last 2 days and she is now here in the emergency department. Denies nausea, vomiting, fever, urinary symptoms, vaginal discharge or vaginal bleeding.   Past Medical History  Diagnosis Date  . H/O: hysterectomy   . Hypertension 01/08/2011  . Hernia, umbilical     Past Surgical History  Procedure Date  . Abdominal hysterectomy   . Hernia repair     Family History  Problem Relation Age of Onset  . Anesthesia problems Neg Hx     Social History:  reports that she has never smoked. She has never used smokeless tobacco. She reports that she does not drink alcohol or use illicit drugs.  Allergies: No Known Allergies  Medications: I have reviewed the patient's current medications.  Results for orders placed during the hospital encounter of 09/02/11 (from the past 48 hour(s))  CBC WITH DIFFERENTIAL     Status: Normal   Collection Time   09/02/11  9:55 AM      Component Value Range Comment   WBC 4.7  4.0 - 10.5 K/uL    RBC 4.82  3.87 - 5.11 MIL/uL    Hemoglobin 13.6  12.0 - 15.0 g/dL    HCT 16.1  09.6 - 04.5 %    MCV 80.5  78.0 - 100.0 fL    MCH 28.2  26.0 - 34.0 pg    MCHC 35.1  30.0 - 36.0 g/dL    RDW 40.9  81.1 - 91.4 %    Platelets 156  150 - 400 K/uL    Neutrophils Relative 50  43 - 77 %    Neutro Abs 2.3  1.7 - 7.7 K/uL    Lymphocytes Relative 36  12 - 46 %    Lymphs Abs 1.7  0.7 - 4.0 K/uL    Monocytes Relative 8  3 - 12 %    Monocytes Absolute 0.4  0.1 - 1.0 K/uL    Eosinophils Relative 5  0 - 5 %    Eosinophils Absolute 0.3  0.0 - 0.7 K/uL    Basophils Relative 0  0 - 1 %    Basophils Absolute 0.0  0.0 - 0.1 K/uL   BASIC METABOLIC PANEL     Status: Abnormal   Collection Time   09/02/11  9:55 AM   Component Value Range Comment   Sodium 140  135 - 145 mEq/L    Potassium 3.3 (*) 3.5 - 5.1 mEq/L    Chloride 105  96 - 112 mEq/L    CO2 25  19 - 32 mEq/L    Glucose, Bld 84  70 - 99 mg/dL    BUN 9  6 - 23 mg/dL    Creatinine, Ser 7.82  0.50 - 1.10 mg/dL    Calcium 9.1  8.4 - 95.6 mg/dL    GFR calc non Af Amer >90  >90 mL/min    GFR calc Af Amer >90  >90 mL/min   LACTIC ACID, PLASMA     Status: Normal   Collection Time   09/02/11  9:55 AM      Component Value Range Comment   Lactic Acid, Venous 1.0  0.5 - 2.2 mmol/L   PROTIME-INR     Status: Normal   Collection Time  09/02/11  9:55 AM      Component Value Range Comment   Prothrombin Time 14.3  11.6 - 15.2 seconds    INR 1.09  0.00 - 1.49   APTT     Status: Normal   Collection Time   09/02/11  9:55 AM      Component Value Range Comment   aPTT 30  24 - 37 seconds   POCT PREGNANCY, URINE     Status: Normal   Collection Time   09/02/11 10:14 AM      Component Value Range Comment   Preg Test, Ur NEGATIVE  NEGATIVE     Ct Abdomen Pelvis W Contrast  09/02/2011  *RADIOLOGY REPORT*  Clinical Data: Periumbilical pain and pressure.  Nausea.  CT ABDOMEN AND PELVIS WITH CONTRAST  Technique:  Multidetector CT imaging of the abdomen and pelvis was performed following the standard protocol during bolus administration of intravenous contrast.  Contrast: 80mL OMNIPAQUE IOHEXOL 300 MG/ML  SOLN  Comparison: 09/07/2010  Findings: Soft tissue prominence is again seen involving the umbilicus in the anterior abdominal wall, however there is no evidence of herniated fat or bowel at this site.  A new rim enhancing fluid collection is seen in the superficial subcutaneous tissues of the umbilicus which measures 1.5 x 1.3 cm and is suspicious for an abscess.  No other hernias are identified.  There is no evidence of bowel wall thickening or dilatation. No intraperitoneal inflammatory process or abnormal fluid collections are identified within the abdomen or  pelvis.  Previously seen left adnexal cystic lesion has resolved.  The liver, gallbladder, spleen, pancreas, adrenal glands, and kidneys are normal appearance.  No evidence of hydronephrosis.  No soft tissue masses or lymphadenopathy identified.  IMPRESSION:  1.  Persistent soft tissue prominence involving the umbilical soft tissues in the abdominal wall, with new 1.5 cm rim subcutaneous enhancing fluid collection. These findings are suspicious for umbilical cellulitis and abscess. 2.  No evidence of periumbilical hernia.  No intraperitoneal process identified within the abdomen or pelvis.  Original Report Authenticated By: Danae Orleans, M.D.    Review of Systems  Constitutional: Negative.  Negative for fever, chills, weight loss, malaise/fatigue and diaphoresis.  HENT: Negative.  Negative for hearing loss, ear pain, nosebleeds, congestion, sore throat, tinnitus and ear discharge.   Eyes: Negative.   Respiratory: Negative.  Negative for stridor.   Cardiovascular: Negative.   Gastrointestinal: Positive for abdominal pain. Negative for heartburn, nausea, vomiting, diarrhea, constipation, blood in stool and melena.  Genitourinary: Negative.   Musculoskeletal: Negative.   Skin: Negative for itching and rash.  Neurological: Negative.  Negative for weakness and headaches.  Endo/Heme/Allergies: Negative.   Psychiatric/Behavioral: Negative.    Blood pressure 131/88, pulse 78, temperature 97.7 F (36.5 C), temperature source Oral, resp. rate 18, SpO2 95.00%. Physical Exam  Constitutional: She is oriented to person, place, and time. She appears well-developed and well-nourished.  HENT:  Head: Normocephalic and atraumatic.  Eyes: Conjunctivae are normal. Pupils are equal, round, and reactive to light.  Neck: Normal range of motion. Neck supple. No JVD present. No tracheal deviation present. No thyromegaly present.  Cardiovascular: Normal rate, regular rhythm and normal heart sounds.  Exam reveals no  gallop and no friction rub.   No murmur heard. Respiratory: Effort normal and breath sounds normal. No respiratory distress. She has no wheezes. She has no rales. She exhibits no tenderness.  GI: Soft. Bowel sounds are normal. She exhibits no distension and no mass. There  is tenderness. There is no rebound and no guarding.  Musculoskeletal: Normal range of motion. She exhibits no edema and no tenderness.  Lymphadenopathy:    She has no cervical adenopathy.  Neurological: She is alert and oriented to person, place, and time. No cranial nerve deficit. Coordination normal.  Skin: Skin is warm and dry. No rash noted. There is erythema. No pallor.  Psychiatric: She has a normal mood and affect.    Assessment/Plan: 1. Infected umbilical cyst 2. I&D 3. wet to dry dressing 4. PO antibiotics x 10 days, pain medication. 5. Follow up with our urgent care in 1 weeks time.  Anne [name] 09/02/2011, 2:18 PM

## 2011-09-04 LAB — WOUND CULTURE: Gram Stain: NONE SEEN

## 2011-09-06 ENCOUNTER — Telehealth (INDEPENDENT_AMBULATORY_CARE_PROVIDER_SITE_OTHER): Payer: Self-pay

## 2011-09-06 NOTE — Telephone Encounter (Signed)
The patient called in very poor english and talked about her umbilical incision and she could not remove the "band".  I think she is referring to the packing.  I tried to explain what she could do after showering but she did not understand.  I asked if she needed to come in and she said she takes the bus.  I said for now just put a clean dressing on the outside and we will have someone call her in the morning that speaks [place].  She has an appointment on 07/18.

## 2011-09-07 ENCOUNTER — Telehealth (INDEPENDENT_AMBULATORY_CARE_PROVIDER_SITE_OTHER): Payer: Self-pay | Admitting: General Surgery

## 2011-09-07 NOTE — Telephone Encounter (Signed)
Anne [name], speaking [place], called pt as promised yesterday, to address her difficulties with bandage removal post op.  With prompting from nursing staff, pt advised to let shower wet/ loosen her bandage, remove it, wash with soap and water, then pat dry.  If she cannot remove the bandage or is unsure what to do, pt will call Anne [name] back and we will bring her in for a Nurse only visit to assist.

## 2011-09-09 ENCOUNTER — Ambulatory Visit (INDEPENDENT_AMBULATORY_CARE_PROVIDER_SITE_OTHER): Payer: Self-pay | Admitting: General Surgery

## 2011-09-09 VITALS — BP 112/80 | HR 84 | Temp 97.4°F | Resp 20 | Ht 59.0 in | Wt 140.2 lb

## 2011-09-09 DIAGNOSIS — Z09 Encounter for follow-up examination after completed treatment for conditions other than malignant neoplasm: Secondary | ICD-10-CM

## 2011-09-09 NOTE — Progress Notes (Signed)
The patient is here status post incision and drainage of an umbilical abscess done in the emergency room at [facility] on 09/02/2011.  In spite of the incision and drainage the patient has a very hard umbilical area with polypoid growth underneath the invagination of her umbilicus. It does not appear to be infected however she still may have some incarcerated omentum in a paraumbilical hernia.  He does not appear to be actively infected and she is due to stop her doxycycline in 3-4 days. At that time she can discontinue the medication.  I cover the wound was a metabolic ointment and a Band-Aid. Have her return to see Dr. Magnus Ivan on 09/30/2011.

## 2011-09-30 ENCOUNTER — Encounter (INDEPENDENT_AMBULATORY_CARE_PROVIDER_SITE_OTHER): Payer: Self-pay | Admitting: Surgery

## 2011-09-30 ENCOUNTER — Ambulatory Visit (INDEPENDENT_AMBULATORY_CARE_PROVIDER_SITE_OTHER): Payer: Self-pay | Admitting: Surgery

## 2011-09-30 VITALS — BP 122/82 | HR 91 | Temp 96.4°F | Ht 58.75 in | Wt 140.2 lb

## 2011-09-30 DIAGNOSIS — Z09 Encounter for follow-up examination after completed treatment for conditions other than malignant neoplasm: Secondary | ICD-10-CM

## 2011-09-30 NOTE — Progress Notes (Signed)
Subjective:     Patient ID: Anne [name], female   DOB: 03-Mar-1970, 41 y.o.   MRN: [id number]  HPI She is here today to reevaluate the umbilicus. She has no complaints. Again, her English is limited  Review of Systems     Objective:   Physical Exam    On exam, the chronic umbilical abscess appears to be healing. 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  Surgery will thus be scheduled

## 2011-11-29 ENCOUNTER — Emergency Department (HOSPITAL_COMMUNITY): Payer: Self-pay

## 2011-11-29 ENCOUNTER — Encounter (INDEPENDENT_AMBULATORY_CARE_PROVIDER_SITE_OTHER): Payer: Self-pay | Admitting: General Surgery

## 2011-11-29 ENCOUNTER — Emergency Department (HOSPITAL_COMMUNITY)
Admission: EM | Admit: 2011-11-29 | Discharge: 2011-11-29 | Disposition: A | Discharge status: Home / Self Care | Payer: Self-pay | Attending: Emergency Medicine | Admitting: Emergency Medicine

## 2011-11-29 ENCOUNTER — Encounter (HOSPITAL_COMMUNITY): Payer: Self-pay | Admitting: *Deleted

## 2011-11-29 ENCOUNTER — Ambulatory Visit (INDEPENDENT_AMBULATORY_CARE_PROVIDER_SITE_OTHER): Payer: Self-pay | Admitting: General Surgery

## 2011-11-29 VITALS — BP 130/72 | HR 98 | Temp 98.2°F | Resp 20 | Ht <= 58 in | Wt 141.0 lb

## 2011-11-29 DIAGNOSIS — K420 Umbilical hernia with obstruction, without gangrene: Secondary | ICD-10-CM

## 2011-11-29 DIAGNOSIS — R1909 Other intra-abdominal and pelvic swelling, mass and lump: Secondary | ICD-10-CM | POA: Insufficient documentation

## 2011-11-29 DIAGNOSIS — L989 Disorder of the skin and subcutaneous tissue, unspecified: Secondary | ICD-10-CM | POA: Insufficient documentation

## 2011-11-29 LAB — URINALYSIS, ROUTINE W REFLEX MICROSCOPIC
Leukocytes, UA: NEGATIVE
Nitrite: NEGATIVE
Specific Gravity, Urine: 1.017 (ref 1.005–1.030)
pH: 6 (ref 5.0–8.0)

## 2011-11-29 LAB — URINE MICROSCOPIC-ADD ON

## 2011-11-29 LAB — BASIC METABOLIC PANEL
Calcium: 9.5 mg/dL (ref 8.4–10.5)
GFR calc Af Amer: >90 mL/min (ref >90)
GFR calc non Af Amer: >90 mL/min (ref >90)
Potassium: 3.4 mEq/L — ABNORMAL LOW (ref 3.5–5.1)
Sodium: 142 mEq/L (ref 135–145)

## 2011-11-29 LAB — CBC WITH DIFFERENTIAL/PLATELET
Basophils Absolute: 0 10*3/uL (ref 0.0–0.1)
HCT: 37.3 % (ref 36.0–46.0)
Lymphocytes Relative: 34 % (ref 12–46)
Neutro Abs: 3.8 10*3/uL (ref 1.7–7.7)
Neutrophils Relative %: 56 % (ref 43–77)
Platelets: 154 10*3/uL (ref 150–400)
RDW: 13.8 % (ref 11.5–15.5)
WBC: 6.8 10*3/uL (ref 4.0–10.5)

## 2011-11-29 LAB — HEPATIC FUNCTION PANEL
ALT: 13 U/L (ref 0–35)
AST: 20 U/L (ref 0–37)
Alkaline Phosphatase: 67 U/L (ref 39–117)
Bilirubin, Direct: <0.1 mg/dL (ref 0.0–0.3)
Total Protein: 7.6 g/dL (ref 6.0–8.3)

## 2011-11-29 MED ORDER — IOHEXOL 300 MG/ML  SOLN
100.0000 mL | Freq: Once | INTRAMUSCULAR | Status: AC | PRN
  Administered 2011-11-29: 100 mL via INTRAVENOUS

## 2011-11-29 MED ORDER — CEPHALEXIN 500 MG PO CAPS: 1000.0000 mg | ORAL_CAPSULE | Freq: Two times a day (BID) | ORAL | Status: DC

## 2011-11-29 MED ORDER — MORPHINE SULFATE 4 MG/ML IJ SOLN
4.0000 mg | Freq: Once | INTRAMUSCULAR | Status: AC
  Administered 2011-11-29: 4 mg via INTRAVENOUS
  Filled 2011-11-29: qty 1

## 2011-11-29 MED ORDER — IOHEXOL 300 MG/ML  SOLN
20.0000 mL | INTRAMUSCULAR | Status: AC
Start: 2011-11-29 — End: 2011-11-29
  Administered 2011-11-29 (×2): 20 mL via ORAL

## 2011-11-29 MED ORDER — ONDANSETRON HCL 4 MG/2ML IJ SOLN
4.0000 mg | Freq: Once | INTRAMUSCULAR | Status: AC
  Administered 2011-11-29: 4 mg via INTRAVENOUS
  Filled 2011-11-29: qty 2

## 2011-11-29 NOTE — ED Provider Notes (Signed)
History     CSN: [id number]  Arrival date & time 11/29/11  1803   First MD Initiated Contact with Patient 11/29/11 1923      Chief Complaint  Patient presents with  . Estrangluated hernia/umbilical     (Consider location/radiation/quality/duration/timing/severity/associated sxs/prior treatment) HPI  41 y.o. french speaking female with past medical history significant for diabetes renal insufficiency sickle cell anemiasent by Dr. Biagio Quint whose note states that Dr. Magnus Ivan drained in the umbilical abscess several months ago which continues to have pain bleeding and drainage. Patient is [place] speaking and comes from [place]. He sent her here for evaluation by Dr. Derrell Lolling. She's not been able to followup secondary to insurance of monetary reasons. Patient reports that she has had Umbilical swelling and discharge since 06/2010 No change in bowel, no bloody stool. Denies fever, rapidly spreading redness  Past Medical History  Diagnosis Date  . H/O: hysterectomy   . Hypertension 01/08/2011  . Hernia, umbilical     Past Surgical History  Procedure Date  . Abdominal hysterectomy   . Hernia repair     Family History  Problem Relation Age of Onset  . Anesthesia problems Neg Hx     History  Substance Use Topics  . Smoking status: Never Smoker   . Smokeless tobacco: Never Used  . Alcohol Use: No    OB History    Grav Para Term Preterm Abortions TAB SAB Ect Mult Living   0 0 0 0 0 0 0 0 0 0       Review of Systems  Constitutional: Negative for fever.  Respiratory: Negative for shortness of breath.   Cardiovascular: Negative for chest pain.  Gastrointestinal: Negative for nausea, vomiting, abdominal pain and diarrhea.  Skin: Positive for rash.  All other systems reviewed and are negative.    Allergies  Review of patient's allergies indicates no known allergies.  Home Medications   Current Outpatient Rx  Name Route Sig Dispense Refill  . IBUPROFEN 200 MG PO TABS Oral  Take 200 mg by mouth every 8 (eight) hours as needed. For pain      BP 129/84  Pulse 94  Temp 98 F (36.7 C) (Oral)  Resp 18  SpO2 99%  Physical Exam  Nursing note and vitals reviewed. Constitutional: She is oriented to person, place, and time. She appears well-developed and well-nourished. No distress.  HENT:  Head: Normocephalic.  Eyes: Conjunctivae normal and EOM are normal.  Cardiovascular: Normal rate.   Pulmonary/Chest: Effort normal and breath sounds normal. No stridor.  Abdominal: Soft. Bowel sounds are normal. She exhibits no distension and no mass. There is no tenderness. There is no rebound and no guarding.       2 cm mass to umbilicus 2 x 2 millimeter abrasion actively bleeding, and no cellulitis. Tender to palpation  Musculoskeletal: Normal range of motion.  Neurological: She is alert and oriented to person, place, and time.  Psychiatric: She has a normal mood and affect.    ED Course  Procedures (including critical care time)  Labs Reviewed  URINALYSIS, ROUTINE W REFLEX MICROSCOPIC - Abnormal; Notable for the following:    Hgb urine dipstick MODERATE (*)     All other components within normal limits  BASIC METABOLIC PANEL - Abnormal; Notable for the following:    Potassium 3.4 (*)     All other components within normal limits  URINE MICROSCOPIC-ADD ON - Abnormal; Notable for the following:    Squamous Epithelial / LPF FEW (*)  All other components within normal limits  CBC WITH DIFFERENTIAL  PREGNANCY, URINE  PROTIME-INR  HEPATIC FUNCTION PANEL   Ct Abdomen Pelvis W Contrast  11/29/2011  *RADIOLOGY REPORT*  Clinical Data: Evaluate for strangulated umbilical hernia.  CT ABDOMEN AND PELVIS WITH CONTRAST  Technique:  Multidetector CT imaging of the abdomen and pelvis was performed following the standard protocol during bolus administration of intravenous contrast.  Contrast: OMNIPAQUE IOHEXOL 300 MG/ML  SOLN  Comparison: 09/02/2011.  Findings: Extreme  lung bases clear.  Normal liver, spleen, gallbladder, pancreas, and kidneys.  Negative aorta and IVC. Normal  stomach, small bowel, colon, and appendix.  Soft tissue prominence again noted involve the umbilicus consistent with umbilical cellulitis, with or without small central abscess. There is a hypodense central portion approximately 15 x 15 mm which could represent a phlegmon or early abscess.  No incarcerated bowel.  No free fluid or free air. Marked enlargement of previously demonstrated normal left ovary, now measuring 77 x 70 x  63 mm primarily due to cystic change.  This displaces the surrounding structures but does not cause bowel obstruction.  Normal bladder, and adnexa. Negative osseous structures.  IMPRESSION: Unchanged umbilical cellulitis.  No incarceration of bowel.  Marked cystic enlargement left ovary now measuring greater than 7 cm in size.   Original Report Authenticated By: Elsie Stain, M.D.      1. Umbilical mass       MDM  Doubt this is a hernia based on the time course and atypical symptoms. CT ordered for further evaluation also basic labs sent to prepare for preop if that is necessary. Patient is comfortable.   CT is read as umbilical cellulitis however there is no induration to the area.  Consult from surgeon Dr. Ancil Linsey is appreciated: He will come to evaluate the patient. Dr. Orvan Falconer states that this is a non-urgent matter and that he can follow with Dr. Magnus Ivan as an outpatient for excision he is written a prescription for pain control I will write the patient a prescription for Keflex to cover for cellulitis.   Pt verbalized understanding and agrees with care plan. Outpatient follow-up and return precautions given.     New Prescriptions   CEPHALEXIN (KEFLEX) 500 MG CAPSULE    Take 2 capsules (1,000 mg total) by mouth 2 (two) times daily.          Wynetta Emery, PA-C 11/29/11 2313

## 2011-11-29 NOTE — ED Provider Notes (Signed)
41 year old female has been having some swelling in the periumbilical area for last year. The area was longer painful, then will drain some blood in the swelling is down and the pain goes away. On exam, she has a lesion in the umbilicus which is about 1 x 1.5 cm and is firm with a small opening was from which blood can be expressed. She also has 2 pedunculated lesions inside the umbilicus which are separate from this one. It I suspect that this is a cyst, possibly a hemangioma. CT has been ordered and she will need referral to general surgery. I expected it to be able to be managed with simple excision.  Medical screening examination/treatment/procedure(s) were conducted as a shared visit with non-physician practitioner(s) and myself.  I personally evaluated the patient during the encounter     Dione Booze, MD 11/29/11 2045

## 2011-11-29 NOTE — ED Notes (Signed)
Pt speaks [place] and sent here by Dr. Biagio Quint per interpreter at bedside for ? estrangluated hernia that has had umbilicial bleeding for one year.  Pt sent here for possible surgical intervention and that dr. Sharlene Motts would probably be on call  Pt with some fever

## 2011-11-29 NOTE — Progress Notes (Signed)
Patient ID: Anne [name], female   DOB: 17-Jul-1970, 41 y.o.   MRN: [id number]  Chief Complaint  Patient presents with  . Follow-up    umb    HPI Anne [name] is a 41 y.o. female.  This patient is known to Dr. Magnus Ivan for prior incision and drainage of umbilical abscess back in July. She has had persistent drainage from the wound since that time but has not been able to schedule elective surgery to correct the problem due to insurance reasons. She says that she has had intermittent drainage from the area of an about one week ago had some purulent drainage and some blood and she is been having off and on bloody drainage from the area. Last night she had more severe discomfort as well as some return of bloody drainage. She has not had fevers or chills but has abdominal discomfort still. She says that she is moving her bowels. The history was difficult to obtain due to the language barrier but we did use a interpreter over the phone. HPI  Past Medical History  Diagnosis Date  . H/O: hysterectomy   . Hypertension 01/08/2011  . Hernia, umbilical     Past Surgical History  Procedure Date  . Abdominal hysterectomy   . Hernia repair     Family History  Problem Relation Age of Onset  . Anesthesia problems Neg Hx     Social History History  Substance Use Topics  . Smoking status: Never Smoker   . Smokeless tobacco: Never Used  . Alcohol Use: No    No Known Allergies  Current Outpatient Prescriptions  Medication Sig Dispense Refill  . IBUPROFEN PO Take 600 mg by mouth. Taking 1 in a.m. And 1 in p.m.        Review of Systems Review of Systems All other review of systems negative or noncontributory except as stated in the HPI  Blood pressure 130/72, pulse 98, temperature 98.2 F (36.8 C), temperature source Temporal, resp. rate 20, height 4\' 9"  (1.448 m), weight 141 lb (63.957 kg).  Physical Exam Physical Exam Physical Exam  Nursing note and vitals  reviewed. Constitutional: She is oriented to person, place, and time. She appears well-developed and well-nourished. No distress.  HENT:  Head: Normocephalic and atraumatic.  Mouth/Throat: No oropharyngeal exudate.  Eyes: Conjunctivae and EOM are normal. Pupils are equal, round, and reactive to light. Right eye exhibits no discharge. Left eye exhibits no discharge. No scleral icterus.  Neck: Normal range of motion. Neck supple. No tracheal deviation present.  Cardiovascular: Normal rate, regular rhythm, normal heart sounds and intact distal pulses.   Pulmonary/Chest: Effort normal and breath sounds normal. No stridor. No respiratory distress. She has no wheezes.  Abdominal: Soft., moderate periumbilical tenderness, ND, she has some purple hue to the bulge at the umbilicus with some bloody drainage from a pinpoint hole in the area.  Questionable strangulated umbilical hernia. She appears uncomfortable sitting up in the chair Musculoskeletal: Normal range of motion. She exhibits no edema and no tenderness.  Neurological: She is alert and oriented to person, place, and time.  Skin: Skin is warm and dry. No rash noted. She is not diaphoretic. No erythema. No pallor.  Psychiatric: She has a normal mood and affect. Her behavior is normal. Judgment and thought content normal.    Data Reviewed Prior notes and old CT  Assessment    Abdominal pain and umbilical drainage I am concerned that she may have an incarcerated and strangulated umbilical hernia  causing this discomfort and is purplish skin color changes and bloody drainage. I have recommended that she go to the emergency room for further evaluation and imaging and possible surgery this evening for repair. This could be a recurrence of her cystic lesion causing the drainage and tenderness as well or recurrent umbilical abscess. I think that the further imaging will be helpful to clarify this issue. Regardless, I think it is prudent to have this  evaluated in the emergency room and we will send her directly over there.    Plan    Transfer to the emergency room for further evaluation and possible treatment       Dorse Locy DAVID 11/29/2011, 6:25 PM

## 2011-11-29 NOTE — Consult Note (Signed)
Reason for Consult: umbilical pain Referring Physician: Dr. Gus [name] is an 41 y.o. female.  HPI: Anne [name] is a 41 year old female is known to Dr. Magnus [name] had been seen previously was to be scheduled for umbilical mass excision.  Anne [name] was seen today in clinic and noted to have abdominal pain was thought to have a possible incarcerated umbilical hernia. Anne [name] was subsequently sent to [facility] ED for further evaluation imaging. Upon evaluation ED [name] underwent CT scan which revealed no umbilical hernia or ventral hernia. Anne umbilical mass that proceeded seen again was shown on CT scan however there was overt abscess seen. [name] his laboratory studies were all within normal limits Anne [name] no left shift. Upon my evaluation in [facility] ER Anne [name] had decreased abdominal pain had minimal drainage coming from her umbilical area.  Anne [name] speaks little [place] a [place] translator was in Anne room for Anne entire interview.  Past Medical History  Diagnosis Date  . H/O: hysterectomy   . Hypertension 01/08/2011  . Hernia, umbilical     Past Surgical History  Procedure Date  . Abdominal hysterectomy   . Hernia repair     Family History  Problem Relation Age of Onset  . Anesthesia problems Neg Hx     Social History:  reports that she has never smoked. She has never used smokeless tobacco. She reports that she does not drink alcohol or use illicit drugs.  Allergies: No Known Allergies  Medications: I have reviewed Anne [name]'s current medications.  Results for orders placed during Anne hospital encounter of 11/29/11 (from Anne past 48 hour(s))  CBC WITH DIFFERENTIAL     Status: Normal   Collection Time   11/29/11  6:47 PM      Component Value Range Comment   WBC 6.8  4.0 - 10.5 K/uL    RBC 4.68  3.87 - 5.11 MIL/uL    Hemoglobin 13.4  12.0 - 15.0 g/dL    HCT 11.9  14.7 - 82.9 %    MCV 79.7  78.0 - 100.0 fL    MCH 28.6  26.0 - 34.0 pg    MCHC 35.9  30.0 -  36.0 g/dL    RDW 56.2  13.0 - 86.5 %    Platelets 154  150 - 400 K/uL    Neutrophils Relative 56  43 - 77 %    Neutro Abs 3.8  1.7 - 7.7 K/uL    Lymphocytes Relative 34  12 - 46 %    Lymphs Abs 2.3  0.7 - 4.0 K/uL    Monocytes Relative 6  3 - 12 %    Monocytes Absolute 0.4  0.1 - 1.0 K/uL    Eosinophils Relative 4  0 - 5 %    Eosinophils Absolute 0.3  0.0 - 0.7 K/uL    Basophils Relative 0  0 - 1 %    Basophils Absolute 0.0  0.0 - 0.1 K/uL   BASIC METABOLIC PANEL     Status: Abnormal   Collection Time   11/29/11  6:47 PM      Component Value Range Comment   Sodium 142  135 - 145 mEq/L    Potassium 3.4 (*) 3.5 - 5.1 mEq/L    Chloride 107  96 - 112 mEq/L    CO2 25  19 - 32 mEq/L    Glucose, Bld 89  70 - 99 mg/dL    BUN 12  6 - 23 mg/dL  Creatinine, Ser 0.73  0.50 - 1.10 mg/dL    Calcium 9.5  8.4 - 16.1 mg/dL    GFR calc non Af Amer >90  >90 mL/min    GFR calc Af Amer >90  >90 mL/min   URINALYSIS, ROUTINE W REFLEX MICROSCOPIC     Status: Abnormal   Collection Time   11/29/11  6:48 PM      Component Value Range Comment   Color, Urine YELLOW  YELLOW    APPearance CLEAR  CLEAR    Specific Gravity, Urine 1.017  1.005 - 1.030    pH 6.0  5.0 - 8.0    Glucose, UA NEGATIVE  NEGATIVE mg/dL    Hgb urine dipstick MODERATE (*) NEGATIVE    Bilirubin Urine NEGATIVE  NEGATIVE    Ketones, ur NEGATIVE  NEGATIVE mg/dL    Protein, ur NEGATIVE  NEGATIVE mg/dL    Urobilinogen, UA 1.0  0.0 - 1.0 mg/dL    Nitrite NEGATIVE  NEGATIVE    Leukocytes, UA NEGATIVE  NEGATIVE   PREGNANCY, URINE     Status: Normal   Collection Time   11/29/11  6:48 PM      Component Value Range Comment   Preg Test, Ur NEGATIVE  NEGATIVE   URINE MICROSCOPIC-ADD ON     Status: Abnormal   Collection Time   11/29/11  6:48 PM      Component Value Range Comment   Squamous Epithelial / LPF FEW (*) RARE    WBC, UA 0-2  <3 WBC/hpf    RBC / HPF 0-2  <3 RBC/hpf    Bacteria, UA RARE  RARE   PROTIME-INR     Status: Normal    Collection Time   11/29/11  7:44 PM      Component Value Range Comment   Prothrombin Time 13.9  11.6 - 15.2 seconds    INR 1.08  0.00 - 1.49   HEPATIC FUNCTION PANEL     Status: Normal   Collection Time   11/29/11  7:44 PM      Component Value Range Comment   Total Protein 7.6  6.0 - 8.3 g/dL    Albumin 3.9  3.5 - 5.2 g/dL    AST 20  0 - 37 U/L    ALT 13  0 - 35 U/L    Alkaline Phosphatase 67  39 - 117 U/L    Total Bilirubin 0.3  0.3 - 1.2 mg/dL    Bilirubin, Direct <0.9  0.0 - 0.3 mg/dL    Indirect Bilirubin NOT CALCULATED  0.3 - 0.9 mg/dL     Ct Abdomen Pelvis W Contrast  11/29/2011  *RADIOLOGY REPORT*  Clinical Data: Evaluate for strangulated umbilical hernia.  CT ABDOMEN AND PELVIS WITH CONTRAST  Technique:  Multidetector CT imaging of Anne abdomen and pelvis was performed following Anne standard protocol during bolus administration of intravenous contrast.  Contrast: OMNIPAQUE IOHEXOL 300 MG/ML  SOLN  Comparison: 09/02/2011.  Findings: Extreme lung bases clear.  Normal liver, spleen, gallbladder, pancreas, and kidneys.  Negative aorta and IVC. Normal  stomach, small bowel, colon, and appendix.  Soft tissue prominence again noted involve Anne umbilicus consistent with umbilical cellulitis, with or without small central abscess. There is a hypodense central portion approximately 15 x 15 mm which could represent a phlegmon or early abscess.  No incarcerated bowel.  No free fluid or free air. Marked enlargement of previously demonstrated normal left ovary, now measuring 77 x 70 x  63 mm  primarily due to cystic change.  This displaces Anne surrounding structures but does not cause bowel obstruction.  Normal bladder, and adnexa. Negative osseous structures.  IMPRESSION: Unchanged umbilical cellulitis.  No incarceration of bowel.  Marked cystic enlargement left ovary now measuring greater than 7 cm in size.   Original Report Authenticated By: Elsie Stain, M.D.     Review of Systems    Constitutional: Negative.   Eyes: Negative.   Respiratory: Negative.   Cardiovascular: Negative.   Gastrointestinal: Positive for abdominal pain.  Musculoskeletal: Negative.   Skin: Negative.   Neurological: Negative.   Endo/Heme/Allergies: Negative.    Blood pressure 111/69, pulse 78, temperature 97.6 F (36.4 C), temperature source Oral, resp. rate 20, SpO2 100.00%. Physical Exam  Constitutional: She is oriented to person, place, and time. She appears well-developed and well-nourished.  HENT:  Head: Normocephalic and atraumatic.  Eyes: Conjunctivae normal and EOM are normal. Pupils are equal, round, and reactive to light.  Neck: Normal range of motion.  Cardiovascular: Normal rate, regular rhythm and normal heart sounds.   Respiratory: Effort normal and breath sounds normal.  GI: Soft. Bowel sounds are normal. There is tenderness in Anne periumbilical area. No hernia.         Anne sinus tract with some dark bloodthe no purulence could be expressed, tenderness palpation around Anne umbilicus  Musculoskeletal: Normal range of motion.  Neurological: She is alert and oriented to person, place, and time.    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 Anne [name] at this time has no incarceration of a hernia and no emergent abdominal exam, and better pain control. I would recommend follow up in clinic and be scheduled electively for umbilical mass excision. I prescribed Anne [name] premedications as Anne pain is cyclical nature.   Marigene Ehlers., Kohle Winner 11/29/2011, 10:46 PM

## 2011-12-08 ENCOUNTER — Other Ambulatory Visit (INDEPENDENT_AMBULATORY_CARE_PROVIDER_SITE_OTHER): Payer: Self-pay | Admitting: Surgery

## 2011-12-08 ENCOUNTER — Ambulatory Visit (INDEPENDENT_AMBULATORY_CARE_PROVIDER_SITE_OTHER): Payer: Self-pay | Admitting: Surgery

## 2011-12-08 ENCOUNTER — Encounter (INDEPENDENT_AMBULATORY_CARE_PROVIDER_SITE_OTHER): Payer: Self-pay | Admitting: Surgery

## 2011-12-08 VITALS — BP 126/84 | HR 82 | Temp 97.6°F | Resp 16 | Ht 59.0 in | Wt 141.8 lb

## 2011-12-08 DIAGNOSIS — IMO0002 Reserved for concepts with insufficient information to code with codable children: Secondary | ICD-10-CM

## 2011-12-08 DIAGNOSIS — Q898 Other specified congenital malformations: Secondary | ICD-10-CM

## 2011-12-08 DIAGNOSIS — N83209 Unspecified ovarian cyst, unspecified side: Secondary | ICD-10-CM

## 2011-12-08 NOTE — Progress Notes (Signed)
Subjective:     Patient ID: Anne [name], female   DOB: [birth year], 41 y.o.   MRN: [id number]  HPI She is here for a followup visit. We have been following her chronic umbilical cyst. She has had had incision and drainage of this in the past. Because of concerns for a possible incarcerated hernia, she was sent for a CAT scan of the abdomen and pelvis. This showed no evidence of hernia but did show a greater than 7 cm left ovarian cyst. She is having pain in her left pelvis. She has minimal discomfort at the umbilicus. She does not speak English very well.  Review of Systems     Objective:   Physical Exam On exam, the umbilicus is stable. There is a palpable mass in the left lower pelvis which is tender.    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    Plan:     A CAT scan, again the cyst is greater than 7 cm and fills palpable on physical examination. Currently, her umbilicus not infected. I believe I will need to remove this chronic cyst and scar at her umbilicus but I believe she is to be seen by her gynecologist as soon as possible to address the cyst in the abdomen.  This is probably unchanged from last year, but seems symptomatic. As I would have to take her to the operating room for the umbilical cyst, this can be addressed at the same time if the gynecologist feel this is appropriate

## 2011-12-10 ENCOUNTER — Encounter: Payer: Self-pay | Admitting: Obstetrics & Gynecology

## 2012-01-03 ENCOUNTER — Encounter: Payer: Self-pay | Admitting: Obstetrics & Gynecology

## 2012-01-03 ENCOUNTER — Ambulatory Visit (INDEPENDENT_AMBULATORY_CARE_PROVIDER_SITE_OTHER): Payer: Self-pay | Admitting: Obstetrics & Gynecology

## 2012-01-03 VITALS — BP 146/102 | HR 82 | Temp 96.2°F | Ht 59.0 in | Wt 139.0 lb

## 2012-01-03 DIAGNOSIS — K429 Umbilical hernia without obstruction or gangrene: Secondary | ICD-10-CM | POA: Insufficient documentation

## 2012-01-03 DIAGNOSIS — N83209 Unspecified ovarian cyst, unspecified side: Secondary | ICD-10-CM

## 2012-01-03 MED ORDER — DICLOFENAC SODIUM 75 MG PO TBEC: 75.0000 mg | DELAYED_RELEASE_TABLET | Freq: Two times a day (BID) | ORAL | Status: DC

## 2012-01-03 NOTE — Patient Instructions (Signed)
Ovarian Cyst The ovaries are small organs that are on each side of the uterus. The ovaries are the organs that produce the female hormones, estrogen and progesterone. An ovarian cyst is a sac filled with fluid that can vary in its size. It is normal for a small cyst to form in women who are in the childbearing age and who have menstrual periods. This type of cyst is called a follicle cyst that becomes an ovulation cyst (corpus luteum cyst) after it produces the women's egg. It later goes away on its own if the woman does not become pregnant. There are other kinds of ovarian cysts that may cause problems and may need to be treated. The most serious problem is a cyst with cancer. It should be noted that menopausal women who have an ovarian cyst are at a higher risk of it being a cancer cyst. They should be evaluated very quickly, thoroughly and followed closely. This is especially true in menopausal women because of the high rate of ovarian cancer in women in menopause. CAUSES AND TYPES OF OVARIAN CYSTS:  FUNCTIONAL CYST: The follicle/corpus luteum cyst is a functional cyst that occurs every month during ovulation with the menstrual cycle. They go away with the next menstrual cycle if the woman does not get pregnant. Usually, there are no symptoms with a functional cyst.  ENDOMETRIOMA CYST: This cyst develops from the lining of the uterus tissue. This cyst gets in or on the ovary. It grows every month from the bleeding during the menstrual period. It is also called a "chocolate cyst" because it becomes filled with blood that turns brown. This cyst can cause pain in the lower abdomen during intercourse and with your menstrual period.  CYSTADENOMA CYST: This cyst develops from the cells on the outside of the ovary. They usually are not cancerous. They can get very big and cause lower abdomen pain and pain with intercourse. This type of cyst can twist on itself, cut off its blood supply and cause severe pain. It  also can easily rupture and cause a lot of pain.  DERMOID CYST: This type of cyst is sometimes found in both ovaries. They are found to have different kinds of body tissue in the cyst. The tissue includes skin, teeth, hair, and/or cartilage. They usually do not have symptoms unless they get very big. Dermoid cysts are rarely cancerous.  POLYCYSTIC OVARY: This is a rare condition with hormone problems that produces many small cysts on both ovaries. The cysts are follicle-like cysts that never produce an egg and become a corpus luteum. It can cause an increase in body weight, infertility, acne, increase in body and facial hair and lack of menstrual periods or rare menstrual periods. Many women with this problem develop type 2 diabetes. The exact cause of this problem is unknown. A polycystic ovary is rarely cancerous.  THECA LUTEIN CYST: Occurs when too much hormone (human chorionic gonadotropin) is produced and over-stimulates the ovaries to produce an egg. They are frequently seen when doctors stimulate the ovaries for invitro-fertilization (test tube babies).  LUTEOMA CYST: This cyst is seen during pregnancy. Rarely it can cause an obstruction to the birth canal during labor and delivery. They usually go away after delivery. SYMPTOMS   Pelvic pain or pressure.  Pain during sexual intercourse.  Increasing girth (swelling) of the abdomen.  Abnormal menstrual periods.  Increasing pain with menstrual periods.  You stop having menstrual periods and you are not pregnant. DIAGNOSIS  The diagnosis can   be made during:  Routine or annual pelvic examination (common).  Ultrasound.  X-ray of the pelvis.  CT Scan.  MRI.  Blood tests. TREATMENT   Treatment may only be to follow the cyst monthly for 2 to 3 months with your caregiver. Many go away on their own, especially functional cysts.  May be aspirated (drained) with a long needle with ultrasound, or by laparoscopy (inserting a tube into  the pelvis through a small incision).  The whole cyst can be removed by laparoscopy.  Sometimes the cyst may need to be removed through an incision in the lower abdomen.  Hormone treatment is sometimes used to help dissolve certain cysts.  Birth control pills are sometimes used to help dissolve certain cysts. HOME CARE INSTRUCTIONS  Follow your caregiver's advice regarding:  Medicine.  Follow up visits to evaluate and treat the cyst.  You may need to come back or make an appointment with another caregiver, to find the exact cause of your cyst, if your caregiver is not a gynecologist.  Get your yearly and recommended pelvic examinations and Pap tests.  Let your caregiver know if you have had an ovarian cyst in the past. SEEK MEDICAL CARE IF:   Your periods are late, irregular, they stop, or are painful.  Your stomach (abdomen) or pelvic pain does not go away.  Your stomach becomes larger or swollen.  You have pressure on your bladder or trouble emptying your bladder completely.  You have painful sexual intercourse.  You have feelings of fullness, pressure, or discomfort in your stomach.  You lose weight for no apparent reason.  You feel generally ill.  You become constipated.  You lose your appetite.  You develop acne.  You have an increase in body and facial hair.  You are gaining weight, without changing your exercise and eating habits.  You think you are pregnant. SEEK IMMEDIATE MEDICAL CARE IF:   You have increasing abdominal pain.  You feel sick to your stomach (nausea) and/or vomit.  You develop a fever that comes on suddenly.  You develop abdominal pain during a bowel movement.  Your menstrual periods become heavier than usual. Document Released: 02/08/2005 Document Revised: 05/03/2011 Document Reviewed: 12/12/2008 Exit[employer] Patient Information 2013 [employer].  

## 2012-01-03 NOTE — Progress Notes (Signed)
History:  41 y.o. G0 here today for left ovarian cyst seen incidentally on CT scan after evaluation for umbilical hernia/cellulitis.  She is [place] speaking, interpreter present for this encounter. She is s/p TAH for benign indications. She had a left ovarian cyst that resolved on its own in 2012.  Reports pain on left side, desires removal of this ovary.  Also reports umbilical pain.  The following portions of the patient's history were reviewed and updated as appropriate: allergies, current medications, past family history, past medical history, past social history, past surgical history and problem list.  Review of Systems:  Pertinent items are noted in HPI.  Objective:  Physical Exam Blood pressure 146/102, pulse 82, temperature 96.2 F (35.7 C), temperature source Oral, height 4\' 11"  (1.499 m), weight 139 lb (63.05 kg). Gen: NAD Abd: Soft, tenderness in umbilical area and LLQ,  nondistended Pelvic: Deferred  Labs and Imaging CA-125 pending  11/29/11 CT ABDOMEN AND PELVIS WITH CONTRAST Findings: Extreme lung bases clear. Normal liver, spleen, gallbladder, pancreas, and kidneys. Negative aorta and IVC. Normal stomach, small bowel, colon, and appendix. Soft tissue prominence again noted involve the umbilicus consistent with umbilical cellulitis, with or without small central abscess. There is a hypodense central portion approximately 15 x 15 mm which could represent a phlegmon or early abscess. No incarcerated bowel. No free fluid or free air. Marked enlargement of previously demonstrated normal left ovary, now measuring 77 x 70 x 63 mm primarily due to cystic change. This displaces the surrounding structures but does not cause bowel obstruction. Normal bladder, and adnexa. Negative osseous structures. IMPRESSION: Unchanged umbilical cellulitis. No incarceration of bowel. Marked cystic enlargement left ovary now measuring greater than 7 cm in size.   Assessment & Plan:  Will obtain pelvic  ultrasound for further characterization of ovarian cyst.  If cyst persists and CA125 is negative, will schedule laparoscopic removal of left ovary in conjunction with Dr. Magnus Ivan who will address her umbilical hernia/cellulitis.   The risks of surgery were discussed in detail with the patient including but not limited to: bleeding; infection; injury to bowel, bladder, ureters and major vessels or other surrounding organs; need for additional procedures including laparotomy; thromboembolic phenomenon, incisional problems and other postoperative or anesthesia complications.  She was told that she will be contacted by our surgical scheduler regarding the time and date of her surgery; routine preoperative instructions of having nothing to eat or drink after midnight on the day prior to surgery and also coming to the hospital 1 1/2 hours prior to her time of surgery were also emphasized.  She was told she may be called for a preoperative appointment about a week prior to surgery and will be given further preoperative instructions at that visit. Diclofenac was prescribed as needed for pain.

## 2012-01-05 ENCOUNTER — Telehealth: Payer: Self-pay | Admitting: Medical

## 2012-01-05 NOTE — Telephone Encounter (Signed)
Spoke with patient using pacific interpreters. Patient was given results of bloodwork as noted below. Confirmed [place] appointment for Monday. Patient plans to keep appointment. She voiced no additional concerns or questions at this time.

## 2012-01-05 NOTE — Telephone Encounter (Signed)
Message copied by Freddi Starr on Wed Jan 05, 2012  1:00 PM ------      Message from: Jaynie Collins A      Created: Tue Jan 04, 2012  9:17 AM       Normal CA125, will await pelvic ultrasound results to help plan the appropriate surgery. Please call to inform patient of results, will need [place] interpreter.

## 2012-01-10 ENCOUNTER — Ambulatory Visit (HOSPITAL_COMMUNITY)
Admission: RE | Admit: 2012-01-10 | Discharge: 2012-01-10 | Disposition: A | Discharge status: Home / Self Care | Payer: Self-pay | Source: Ambulatory Visit | Attending: Obstetrics & Gynecology | Admitting: Obstetrics & Gynecology

## 2012-01-10 ENCOUNTER — Encounter: Payer: Self-pay | Admitting: Obstetrics & Gynecology

## 2012-01-10 DIAGNOSIS — R1033 Periumbilical pain: Secondary | ICD-10-CM | POA: Insufficient documentation

## 2012-01-10 DIAGNOSIS — N9489 Other specified conditions associated with female genital organs and menstrual cycle: Secondary | ICD-10-CM | POA: Insufficient documentation

## 2012-01-10 DIAGNOSIS — N83209 Unspecified ovarian cyst, unspecified side: Secondary | ICD-10-CM | POA: Insufficient documentation

## 2012-04-11 ENCOUNTER — Other Ambulatory Visit: Payer: Self-pay

## 2012-04-11 DIAGNOSIS — N83209 Unspecified ovarian cyst, unspecified side: Secondary | ICD-10-CM

## 2012-04-14 ENCOUNTER — Ambulatory Visit (HOSPITAL_COMMUNITY): Payer: Self-pay | Attending: Obstetrics and Gynecology

## 2012-04-27 ENCOUNTER — Other Ambulatory Visit: Payer: Self-pay | Admitting: Obstetrics and Gynecology

## 2012-04-27 ENCOUNTER — Ambulatory Visit (HOSPITAL_COMMUNITY)
Admission: RE | Admit: 2012-04-27 | Discharge: 2012-04-27 | Disposition: A | Discharge status: Home / Self Care | Payer: Self-pay | Source: Ambulatory Visit | Attending: Obstetrics and Gynecology | Admitting: Obstetrics and Gynecology

## 2012-04-27 DIAGNOSIS — N83209 Unspecified ovarian cyst, unspecified side: Secondary | ICD-10-CM | POA: Insufficient documentation

## 2012-04-27 DIAGNOSIS — Z90710 Acquired absence of both cervix and uterus: Secondary | ICD-10-CM | POA: Insufficient documentation

## 2012-05-18 IMAGING — US US PELVIS COMPLETE
1 series · 14 of 25 positions shown · non-contrast
Comparison: MRI [DATE]

CLINICAL DATA: History of left adnexal mass.  Post hysterectomy 5
years ago.

TRANSABDOMINAL AND TRANSVAGINAL ULTRASOUND OF PELVIS
TECHNIQUE: Both transabdominal and transvaginal ultrasound
examinations of the pelvis were performed. Transabdominal technique
was performed for global imaging of the pelvis including uterus,
ovaries, adnexal regions, and pelvic cul-de-sac.

[Series 1: us pelvis complete · 14 of 73 slices shown]
[im 1/73]
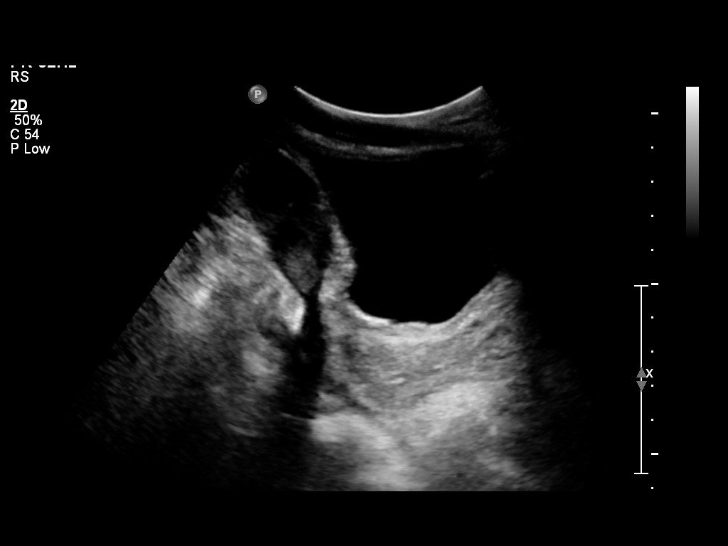
[im 7/73]
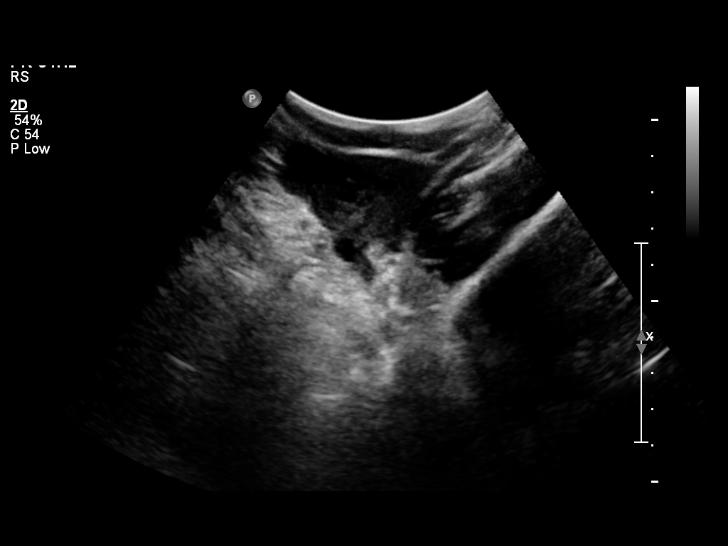
[im 13/73]
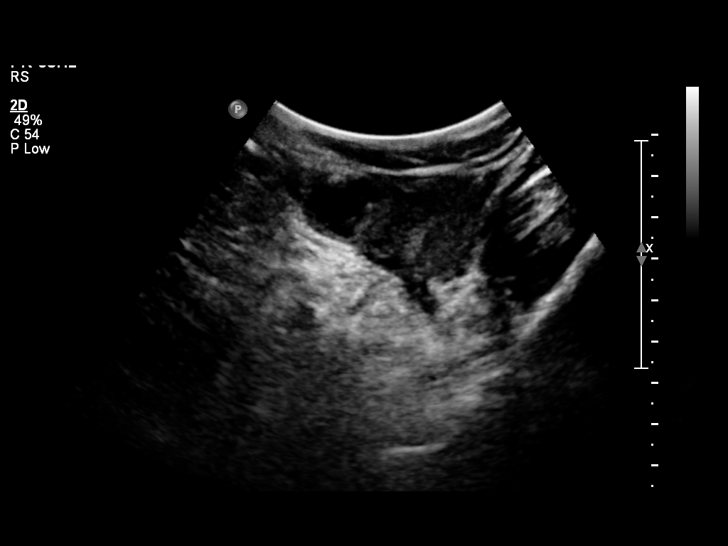
[im 19/73]
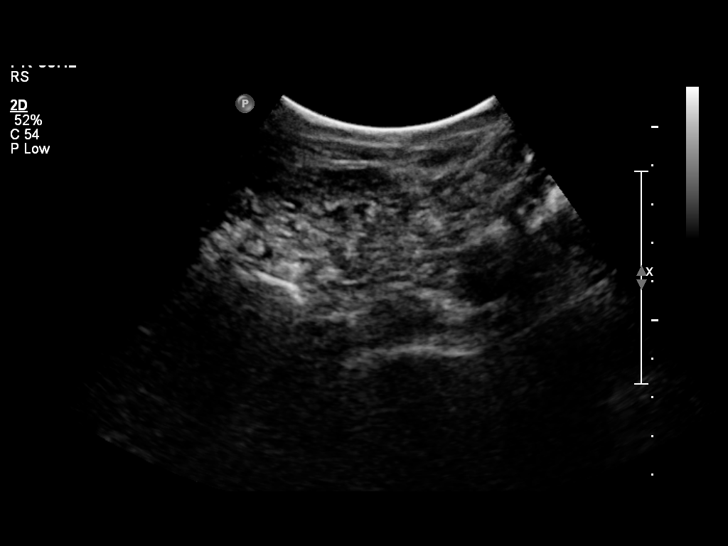
[im 25/73]
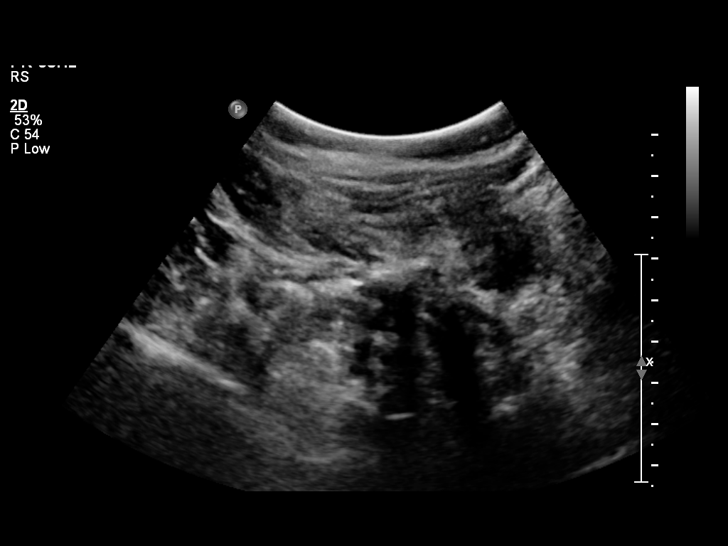
[im 28/73]
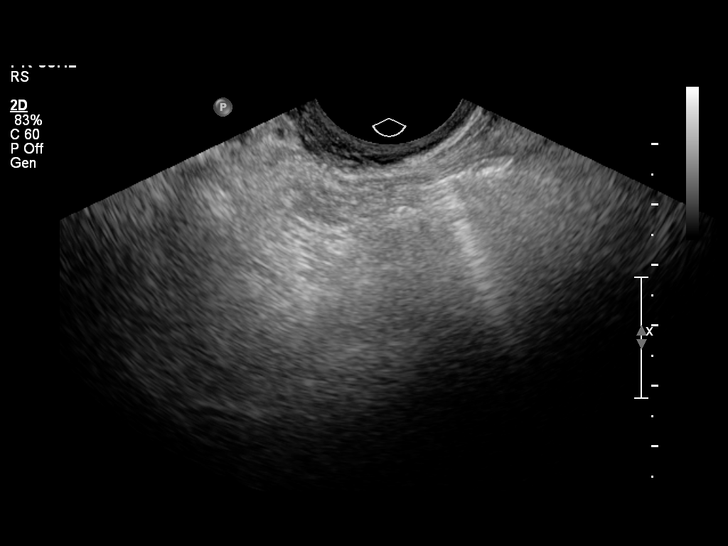
[im 34/73]
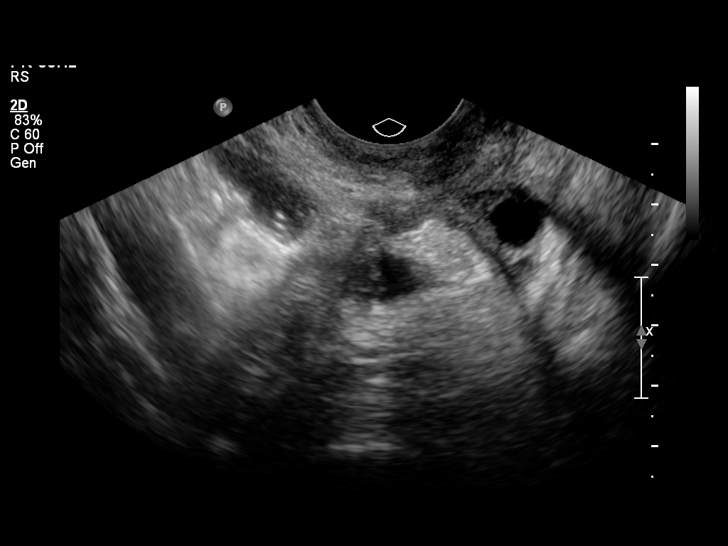
[im 40/73]
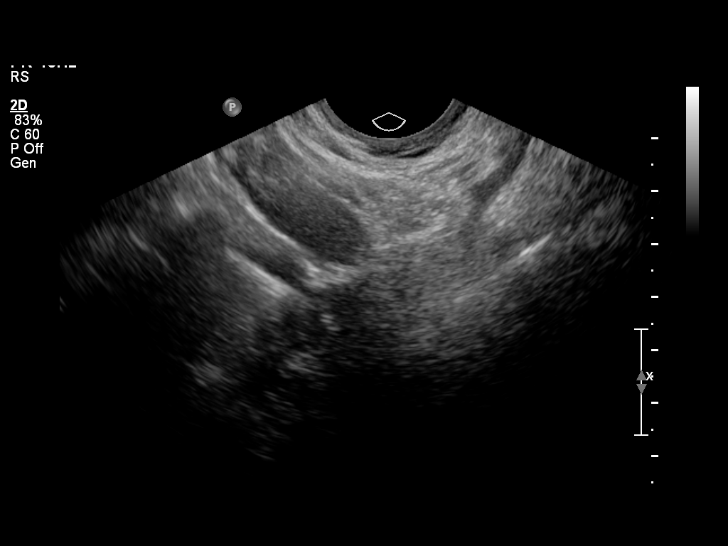
[im 46/73]
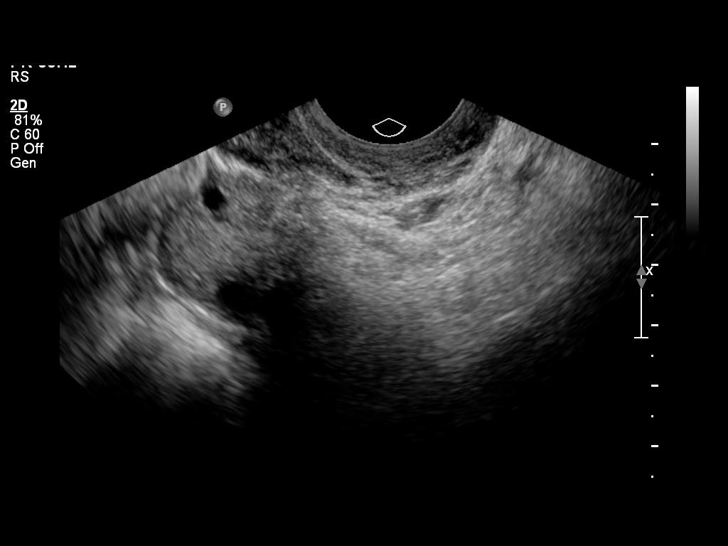
[im 49/73]
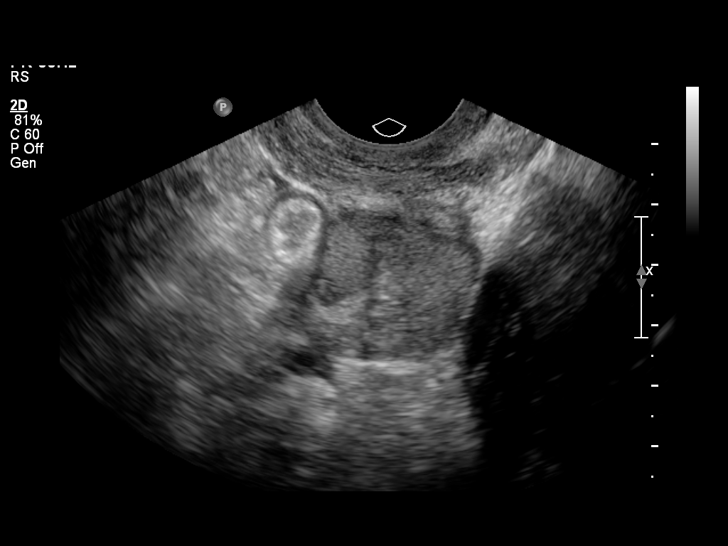
[im 55/73]
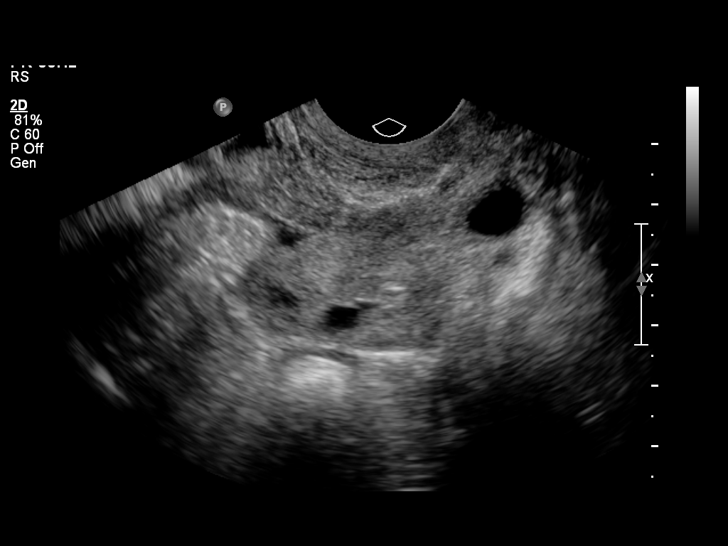
[im 61/73]
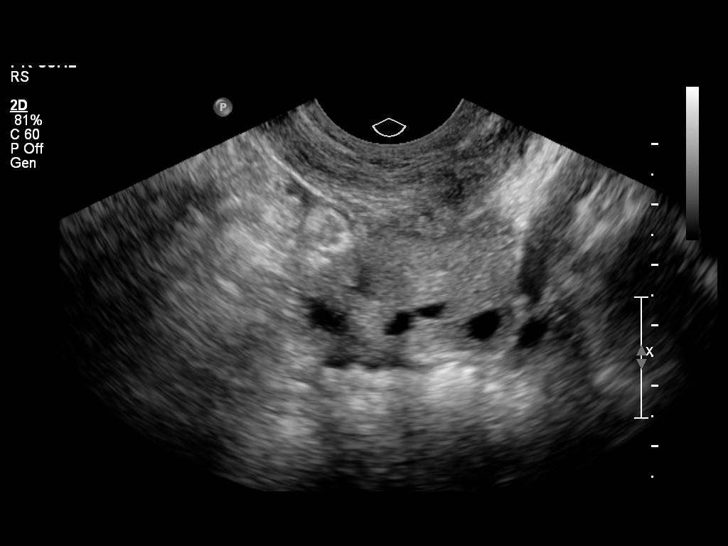
[im 67/73]
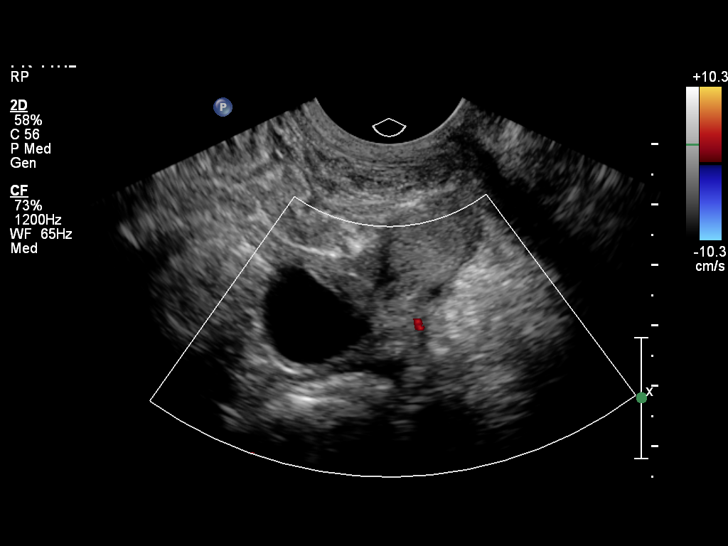
[im 73/73]
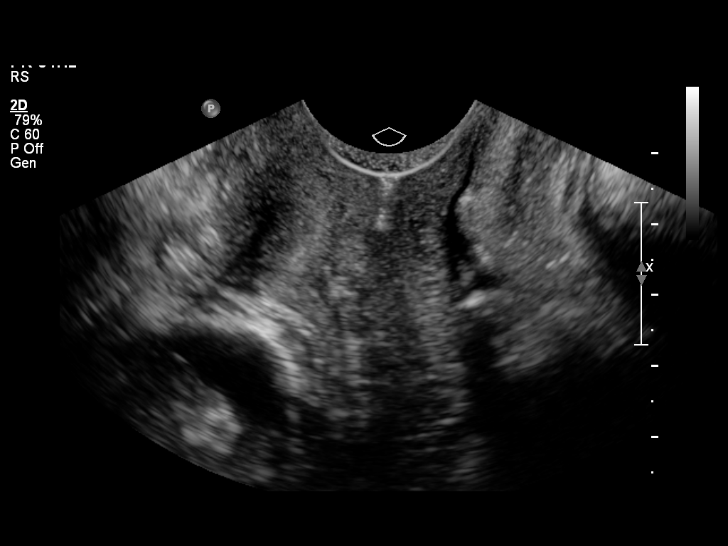

[14 of 25 positions shown; findings below may reference images not displayed]

It was necessary to proceed with endovaginal exam following the
transabdominal exam to visualize the vaginal cuff and adnexa.
FINDINGS: Uterus: Has been surgically removed.  A normal vaginal cuff is seen

Endometrium: Not applicable

Right ovary:  Is not seen with confidence either transabdominally
or endovaginally

Left ovary: Measures 5.6 x 2.3 by 2.5 cm and contains several
follicles, the largest measuring 1.7 x 1.1 by 1.8 cm. The
previously noted cystic lesion associated with the left ovary is no
longer visualized.

Other findings: A trace of simple free fluid is noted in the cul-de-
sac.
IMPRESSION: Interval resolution of the left ovarian cystic lesion seen by MRI.
No worrisome ovarian lesions are identified today.

Normal vaginal cuff.

## 2012-06-19 ENCOUNTER — Encounter (HOSPITAL_COMMUNITY): Payer: Self-pay

## 2012-06-19 ENCOUNTER — Emergency Department (INDEPENDENT_AMBULATORY_CARE_PROVIDER_SITE_OTHER)
Admission: EM | Admit: 2012-06-19 | Discharge: 2012-06-19 | Disposition: A | Discharge status: Home / Self Care | Payer: Self-pay | Source: Home / Self Care | Attending: Emergency Medicine | Admitting: Emergency Medicine

## 2012-06-19 DIAGNOSIS — L0390 Cellulitis, unspecified: Secondary | ICD-10-CM

## 2012-06-19 MED ORDER — CEPHALEXIN 500 MG PO CAPS: 500.0000 mg | ORAL_CAPSULE | Freq: Three times a day (TID) | ORAL | Status: DC

## 2012-06-19 MED ORDER — MUPIROCIN 2 % EX OINT: TOPICAL_OINTMENT | Freq: Three times a day (TID) | CUTANEOUS | Status: DC

## 2012-06-19 MED ORDER — CEFTRIAXONE SODIUM 1 G IJ SOLR
INTRAMUSCULAR | Status: AC
  Filled 2012-06-19: qty 10

## 2012-06-19 MED ORDER — IBUPROFEN 800 MG PO TABS
800.0000 mg | ORAL_TABLET | Freq: Once | ORAL | Status: AC
  Administered 2012-06-19: 800 mg via ORAL

## 2012-06-19 MED ORDER — CEFTRIAXONE SODIUM 1 G IJ SOLR
1.0000 g | Freq: Once | INTRAMUSCULAR | Status: AC
  Administered 2012-06-19: 1 g via INTRAMUSCULAR

## 2012-06-19 MED ORDER — LIDOCAINE HCL (PF) 1 % IJ SOLN
INTRAMUSCULAR | Status: AC
  Filled 2012-06-19: qty 5

## 2012-06-19 MED ORDER — OXYCODONE-ACETAMINOPHEN 5-325 MG PO TABS: ORAL_TABLET | ORAL | Status: DC

## 2012-06-19 MED ORDER — SULFAMETHOXAZOLE-TMP DS 800-160 MG PO TABS: 1.0000 | ORAL_TABLET | Freq: Two times a day (BID) | ORAL | Status: DC

## 2012-06-19 MED ORDER — IBUPROFEN 800 MG PO TABS
ORAL_TABLET | ORAL | Status: AC
  Filled 2012-06-19: qty 1

## 2012-06-19 NOTE — ED Notes (Signed)
Reports she has been having problem w her navel for past couple of days; area oozing blood, ?pustular pockets?

## 2012-06-19 NOTE — ED Provider Notes (Addendum)
Chief Complaint:   Chief Complaint  Patient presents with  . Skin Problem    History of Present Illness:   Anne [name] is a 42 year old female who has a four-day history of pain around her umbilicus and noted redness and swelling starting yesterday. The patient speaks some [place] but requested a translator phone for [place], so the bulk of the history was obtained with the help of a telephone translator. The pain is rated as an 8/10 in intensity. She denies any drainage from the area. There's been no fever or chills, no nausea or vomiting, constipation, diarrhea, urinary or GYN complaints. The patient had a similar infection in November of last year. She was thought to have had an incarcerated umbilical hernia. A CT scan was done and it showed only cellulitis in the umbilical area with no evidence of an incarcerated hernia. She was treated with some antibiotics and this got better but it has recurred again.  Review of Systems:  Other than noted above, the patient denies any of the following symptoms: Systemic:  No fever, chills, sweats, weight loss, or fatigue. ENT:  No nasal congestion, rhinorrhea, sore throat, swelling of lips, tongue or throat. Resp:  No cough, wheezing, or shortness of breath. Skin:  No rash, itching, nodules, or suspicious lesions.  PMFSH:  Past medical history, family history, social history, meds, and allergies were reviewed.   Physical Exam:   Vital signs:  BP 133/93  Pulse 88  Temp(Src) 98.3 F (36.8 C) (Oral)  Resp 16  SpO2 100% Gen:  Alert, oriented, in no distress. ENT:  Pharynx clear, no intraoral lesions, moist mucous membranes. Lungs:  Clear to auscultation. Skin:  There is a 1 cm nodule in the superior portion of the umbilicus. This was firm and nonfluctuant. There was a small amount of bloody drainage but no purulent or serous drainage. Remainder the abdomen was nontender. Bowel sounds are normal. No organomegaly or mass.    Course in Urgent Care  Center:   Given Rocephin 1 g IM and ibuprofen 800 mg by mouth since she will be taking the bus home.  Assessment:  The encounter diagnosis was Cellulitis.  I do not see any evidence of a localized collection of pus, and do not think she needs to have an I&D today. There is nothing to culture. This does not appear to be an incarcerated umbilical hernia. I am not sure why this keeps recurring. I told the patient that perhaps application of Bactroban ointment on it twice daily basis, chronically might help to prevent recurrences and, if that does not work, consider surgical referral for exploration. She had been referred to a surgeon previously, but apparently did not go. The surgeon was Dr. Magnus Ivan.  Plan:   1.  The following meds were prescribed:   Discharge Medication List as of 06/19/2012 10:54 AM    START taking these medications   Details  cephALEXin (KEFLEX) 500 MG capsule Take 1 capsule (500 mg total) by mouth 3 (three) times daily., Starting 06/19/2012, Until Discontinued, Normal    mupirocin ointment (BACTROBAN) 2 % Apply topically 3 (three) times daily., Starting 06/19/2012, Until Discontinued, Normal    !! oxyCODONE-acetaminophen (PERCOCET) 5-325 MG per tablet 1 to 2 tablets every 6 hours as needed for pain., Print    sulfamethoxazole-trimethoprim (BACTRIM DS) 800-160 MG per tablet Take 1 tablet by mouth 2 (two) times daily., Starting 06/19/2012, Until Discontinued, Normal     !! - Potential duplicate medications found. Please discuss with provider.  2.  The patient was instructed in symptomatic care and handouts were given. 3.  The patient was told to return if becoming worse in any way, for a scheduled recheck in 48 hours, and given some red flag symptoms such as fever, worsening pain, or vomiting that would indicate earlier return.     Reuben Likes, MD 06/19/12 1302  Reuben Likes, MD 06/19/12 [phone]

## 2012-06-21 ENCOUNTER — Ambulatory Visit (INDEPENDENT_AMBULATORY_CARE_PROVIDER_SITE_OTHER): Payer: Self-pay | Admitting: Surgery

## 2012-06-21 ENCOUNTER — Encounter (HOSPITAL_COMMUNITY): Payer: Self-pay | Admitting: *Deleted

## 2012-06-21 ENCOUNTER — Encounter (INDEPENDENT_AMBULATORY_CARE_PROVIDER_SITE_OTHER): Payer: Self-pay | Admitting: Surgery

## 2012-06-21 ENCOUNTER — Emergency Department (INDEPENDENT_AMBULATORY_CARE_PROVIDER_SITE_OTHER)
Admission: EM | Admit: 2012-06-21 | Discharge: 2012-06-21 | Disposition: A | Discharge status: Home / Self Care | Payer: Self-pay | Source: Home / Self Care | Attending: Emergency Medicine | Admitting: Emergency Medicine

## 2012-06-21 VITALS — BP 116/72 | HR 68 | Temp 97.8°F | Resp 16 | Wt 142.6 lb

## 2012-06-21 DIAGNOSIS — L02219 Cutaneous abscess of trunk, unspecified: Secondary | ICD-10-CM

## 2012-06-21 DIAGNOSIS — L03319 Cellulitis of trunk, unspecified: Secondary | ICD-10-CM

## 2012-06-21 DIAGNOSIS — IMO0002 Reserved for concepts with insufficient information to code with codable children: Secondary | ICD-10-CM | POA: Insufficient documentation

## 2012-06-21 DIAGNOSIS — L03316 Cellulitis of umbilicus: Secondary | ICD-10-CM

## 2012-06-21 NOTE — ED Notes (Signed)
Pt  Given a  Ride  To  [facility]         By  Sh[facility]  She  Has  An  appt  now

## 2012-06-21 NOTE — Patient Instructions (Signed)
Continue your antibiotics Change the dressing as needed We should see you in about two weeks to schedule surgery to remove the cyst from your umbilicus

## 2012-06-21 NOTE — Progress Notes (Signed)
Chief complaint: Infected umbilicus  History of present illness: This patient comes into the urgent office today with a recurrent infection at her umbilicus. She was recently seen in the ED and started on antibiotics for this. She has been seen numerous times in this office by Dr. Abigail Miyamoto and he drained an umbilical cyst at one point. There was a plan to excise this but she never came back to get that scheduled. She's had increasing pain over the last several days, but no drainage. She was seen at an urgent care and started on antibiotics and the pain has improved.  Past history family history etc. Are noted in the electronic medical record and not redictated here.  Exam: Vital signs:BP 116/72  Pulse 68  Temp(Src) 97.8 F (36.6 C) (Temporal)  Resp 16  Wt 142 lb 9.6 oz (64.683 kg)  BMI 28.79 kg/m2 General: Patient alert oriented, no distress Abdomen: Soft and benign. There is a supraumbilical mass basically at the top of the umbilicus which is exquisitely tender and firm consistent with either an abscess or cellulitis. There is no surrounding erythema. The rest of the abdomen is basically benign as noted earlier.  Data reviewed: I reviewed the prior office notes, there is an emergency department visit, and the scans. In the past this scan has shown umbilical cellulitis without abscess or hernia  Impression: Recurrent umbilical infection with likely a underlying umbilical cyst is the cause for the recurring infections.  Plan: I recommended that she continue her antibiotics and we opened this area to be sure there is no purulent drainage. I think once this area resolves should it be scheduled for an excision under anesthesia. She was agreeable. We had some speaks [place] with her for the exam to translate.  Procedure note: The umbilicus was anesthetized 1% Xylocaine with epi, prepped with Betadine and a vertical incision is made and the old scar. There is chronic inflamed tissue  underneath was a little flexible granulation and inflammation. I packed it with some iodoform gauze. There is no frank pus to drain.

## 2012-06-21 NOTE — ED Notes (Addendum)
Pt  Seen  ucc   sev  Days  Ago   For  Skin infection near  [employer]  Says  Doing  Better       Got  meds  Filled     She  Also  Has   An  Umbilical  Hernia  incacerated  Which  She  Has  Not  Had       It  Repaired

## 2012-06-21 NOTE — ED Provider Notes (Signed)
History     CSN: [id number]  Arrival date & time 06/21/12  1112   First MD Initiated Contact with Patient 06/21/12 1140      Chief Complaint  Patient presents with  . Wound Check    (Consider location/radiation/quality/duration/timing/severity/associated sxs/prior treatment) Patient is a 42 y.o. female presenting with wound check. The history is provided by the patient. No language interpreter was used.  Wound Check This is a recurrent problem. The current episode started more than 2 days ago. The problem occurs constantly. The problem has not changed since onset.Pertinent negatives include no abdominal pain. Nothing aggravates the symptoms. Nothing relieves the symptoms. Treatments tried: ON CEPHALEXIN AND HYROCODONE. The treatment provided mild relief.    Past Medical History  Diagnosis Date  . H/O: hysterectomy   . Hypertension 01/08/2011  . Hernia, umbilical     Past Surgical History  Procedure Laterality Date  . Abdominal hysterectomy    . Hernia repair      Family History  Problem Relation Age of Onset  . Anesthesia problems Neg Hx     History  Substance Use Topics  . Smoking status: Never Smoker   . Smokeless tobacco: Never Used  . Alcohol Use: No    OB History   Grav Para Term Preterm Abortions TAB SAB Ect Mult Living   0 0 0 0 0 0 0 0 0 0       Review of Systems  Constitutional: Negative for fever.  Gastrointestinal: Negative for vomiting, abdominal pain and diarrhea.  Skin:       SAYS FOLLOW-UP ON CELLULITIS UMBILICAL STUMP  All other systems reviewed and are negative.    Allergies  Review of patient's allergies indicates no known allergies.  Home Medications   Current Outpatient Rx  Name  Route  Sig  Dispense  Refill  . cephALEXin (KEFLEX) 500 MG capsule   Oral   Take 1 capsule (500 mg total) by mouth 3 (three) times daily.   30 capsule   0   . diclofenac (VOLTAREN) 75 MG EC tablet   Oral   Take 1 tablet (75 mg total) by mouth 2  (two) times daily with a meal.   60 tablet   2   . ibuprofen (ADVIL,MOTRIN) 200 MG tablet   Oral   Take 200 mg by mouth every 8 (eight) hours as needed. For pain         . mupirocin ointment (BACTROBAN) 2 %   Topical   Apply topically 3 (three) times daily.   22 g   12   . oxyCODONE-acetaminophen (PERCOCET) 5-325 MG per tablet      1 to 2 tablets every 6 hours as needed for pain.   20 tablet   0   . oxyCODONE-acetaminophen (PERCOCET/ROXICET) 5-325 MG per tablet   Oral   Take 1 tablet by mouth as needed.         . sulfamethoxazole-trimethoprim (BACTRIM DS) 800-160 MG per tablet   Oral   Take 1 tablet by mouth 2 (two) times daily.   20 tablet   0     BP 127/91  Pulse 84  Temp(Src) 98.6 F (37 C) (Oral)  Resp 16  SpO2 100%  Physical Exam  Constitutional: She appears well-developed and well-nourished.  HENT:  Head: Normocephalic and atraumatic.  Eyes: Pupils are equal, round, and reactive to light.  Neck: Normal range of motion.  Cardiovascular: Normal rate.   Pulmonary/Chest: Effort normal.  Abdominal: Soft. Bowel sounds are  normal. She exhibits distension.  AGAIN NOTED 1 CM NODULE SUPERIOR PORTION UMBILICUS, FIRM AND NONFLUCTUANT, NO DRAINAGE NOTED REST OF THE ABDOMEN SOFT,.NONTENDER NO ORGANOMEGALY    ED Course  Procedures (including critical care time)  Labs Reviewed - No data to display No results found.   1. Cellulitis of umbilicus       MDM  PATIENT WAS ADVISED FOLLOW-UP WITH HER SURGEON, DR. Magnus Ivan WHOM SHE HAD SEEN 6 MONTHS AGO FOR THE SAME PROBLEM        Vara Guardian Poydras, MD 06/22/12 2045

## 2012-07-05 ENCOUNTER — Ambulatory Visit (INDEPENDENT_AMBULATORY_CARE_PROVIDER_SITE_OTHER): Payer: Self-pay | Admitting: Surgery

## 2012-07-05 ENCOUNTER — Encounter (INDEPENDENT_AMBULATORY_CARE_PROVIDER_SITE_OTHER): Payer: Self-pay | Admitting: Surgery

## 2012-07-05 VITALS — BP 138/96 | HR 80 | Temp 97.6°F | Ht 58.25 in | Wt 140.2 lb

## 2012-07-05 DIAGNOSIS — Z09 Encounter for follow-up examination after completed treatment for conditions other than malignant neoplasm: Secondary | ICD-10-CM

## 2012-07-05 NOTE — Progress Notes (Signed)
Subjective:     Patient ID: Anne [name], female   DOB: December 02, 1970, 42 y.o.   MRN: [id number]  HPI She is here for followup status post her most recent incision and drainage of the infected chronic umbilical cyst.  This has recurred multiple times. She also has an ovarian cyst that is removable according to her gynecologist.  Review of Systems     Objective:   Physical Exam On exam, the wound is healing well it remains open    Assessment:     Chronically infected umbilical cyst     Plan:     Surgical removal of this was recommended as she continues to have chronic infections at the umbilicus. This would hopefully prevent further infection or even a necrotizing infection. We will schedule this and try to coordinate it with the gynecologist

## 2012-07-06 ENCOUNTER — Encounter (INDEPENDENT_AMBULATORY_CARE_PROVIDER_SITE_OTHER): Payer: Self-pay | Admitting: Surgery

## 2012-07-07 ENCOUNTER — Encounter: Payer: Self-pay | Admitting: *Deleted

## 2012-07-10 ENCOUNTER — Other Ambulatory Visit: Payer: Self-pay | Admitting: *Deleted

## 2012-07-10 DIAGNOSIS — N83209 Unspecified ovarian cyst, unspecified side: Secondary | ICD-10-CM

## 2012-07-19 ENCOUNTER — Ambulatory Visit (HOSPITAL_COMMUNITY)
Admission: RE | Admit: 2012-07-19 | Discharge: 2012-07-19 | Disposition: A | Discharge status: Home / Self Care | Payer: Self-pay | Source: Ambulatory Visit | Attending: Obstetrics & Gynecology | Admitting: Obstetrics & Gynecology

## 2012-07-19 DIAGNOSIS — N9489 Other specified conditions associated with female genital organs and menstrual cycle: Secondary | ICD-10-CM | POA: Insufficient documentation

## 2012-07-19 DIAGNOSIS — N83209 Unspecified ovarian cyst, unspecified side: Secondary | ICD-10-CM | POA: Insufficient documentation

## 2012-07-19 DIAGNOSIS — Z90710 Acquired absence of both cervix and uterus: Secondary | ICD-10-CM | POA: Insufficient documentation

## 2012-07-20 ENCOUNTER — Telehealth: Payer: Self-pay | Admitting: *Deleted

## 2012-07-20 NOTE — Telephone Encounter (Signed)
Patient informed of her results. She was very happy that she doesn't need surgery for the cyst. She requested that tomorrows appt be canceled. Patient had no questions.

## 2012-07-20 NOTE — Telephone Encounter (Signed)
Message copied by Mannie Stabile on Thu Jul 20, 2012 11:29 AM ------      Message from: Darrel Hoover      Created: Thu Jul 20, 2012  8:15 AM      Regarding: FW: Appointment on 07/20/12 tomorrow at 8am with me - Not needed                   ----- Message -----         From: Tereso Newcomer, MD         Sent: 07/20/2012   7:29 AM           To: Juliette Mangle, RN      Subject: Appointment on 07/20/12 tomorrow at 8am with #            Good morning Tresa Endo!            Ms Dowse has an appointment with me at 8 am tomorrow morning to discuss GYN surgery.  As per her ultrasound, there is no indication.  Please call her to inform her that there is no need for GYN surgery.  She can still come in if she insists but I do not have any reason to do any GYN surgery and will not remove a normal ovary; my answer will still be the same. Surgery is not benign; things can go horribly wrong!!!            Thank you!            [facility] ------

## 2012-07-21 ENCOUNTER — Encounter: Payer: Self-pay | Admitting: Obstetrics & Gynecology

## 2012-07-21 ENCOUNTER — Other Ambulatory Visit (INDEPENDENT_AMBULATORY_CARE_PROVIDER_SITE_OTHER): Payer: Self-pay | Admitting: Surgery

## 2012-08-03 ENCOUNTER — Telehealth (INDEPENDENT_AMBULATORY_CARE_PROVIDER_SITE_OTHER): Payer: Self-pay

## 2012-08-03 NOTE — Telephone Encounter (Signed)
Telephone call from patient.  Patient speaks french, Development worker, community at front desk interpreted conversation.  Patient concerned that she is slightly bleeding from umbilical cyst site.  Less than a tsp, more like a "little trickle".  No pain, no fever, no redness around the area, no "puss" drainage, no foul odor.  Would like to know what to do.  I advised pt to place a clean dry gauze dressing over the area.  Instructed patient to call us back if bleeding amount increases, onset of pain, onset of fever, onset of "puss" drainage or odor or redness to the area.  Patient is scheduled for surgery with Dr. Magnus Ivan on 08/21/12.  Patient states overall no change in open cyst area since last office visit.  Patient agrees with plan.  / Marlowe Aschoff, RN

## 2012-08-04 ENCOUNTER — Encounter (HOSPITAL_COMMUNITY): Payer: Self-pay | Admitting: Emergency Medicine

## 2012-08-04 ENCOUNTER — Emergency Department (HOSPITAL_COMMUNITY)
Admission: EM | Admit: 2012-08-04 | Discharge: 2012-08-04 | Disposition: A | Discharge status: Home / Self Care | Payer: No Typology Code available for payment source | Source: Home / Self Care

## 2012-08-04 ENCOUNTER — Telehealth (INDEPENDENT_AMBULATORY_CARE_PROVIDER_SITE_OTHER): Payer: Self-pay

## 2012-08-04 DIAGNOSIS — R1909 Other intra-abdominal and pelvic swelling, mass and lump: Secondary | ICD-10-CM

## 2012-08-04 MED ORDER — TRAMADOL HCL 50 MG PO TABS: 50.0000 mg | ORAL_TABLET | Freq: Four times a day (QID) | ORAL | Status: DC | PRN

## 2012-08-04 MED ORDER — NYSTATIN 100000 UNIT/GM EX POWD: Freq: Four times a day (QID) | CUTANEOUS | Status: DC

## 2012-08-04 NOTE — Telephone Encounter (Signed)
Patient speaks [place], Development worker, community at front desk interpreted.  Patient states the bleeding from her umbilical cyst has increased over night.  Now complaining of pain an 8 out of 10.  She has been unable to sleep all night because of the pain.  No c/o fever overnight.   I advised patient to go to the ER for evaluation of pain and bleeding.  Patient agrees with plan.  / Ivianna Notch

## 2012-08-04 NOTE — ED Notes (Signed)
Patient has been seen for the same.  Patient reports she has been evaluiated by [facility] [place] [facility] for the same and is planning surgery at the end of the month.  Patient initially thought to have an umbilical hernia, then told it is a cyst and needs surgery.  Patient is having pain at umbilicus and bleeding.  Reports pain is worsening and has called [facility] and they said no emergent times available in morning, patient reports she cannot wait for the afternoon.

## 2012-08-04 NOTE — ED Provider Notes (Signed)
History     CSN: [id number]  Arrival date & time 08/04/12  1012   First MD Initiated Contact with Patient 08/04/12 1032      Chief Complaint  Patient presents with  . Abdominal Pain    (Consider location/radiation/quality/duration/timing/severity/associated sxs/prior treatment) HPI Comments: 42 year old female who appears quite older presents with an umbilical mass that she has had for over 2 years. Is not unusual for her to have intermittent slow, light bleeding, however in the past 2 days she has had increased tenderness to the umbilical mass and a little more bleeding than usual. She has been applying tissues. I have compared the photograph taken by Dr. Lorenz Coaster on her last visit to the appearance of today's presentation and there appears to be no significant change. Denies fevers, nausea, vomiting.   Past Medical History  Diagnosis Date  . H/O: hysterectomy   . Hypertension 01/08/2011  . Hernia, umbilical     Past Surgical History  Procedure Laterality Date  . Abdominal hysterectomy    . Hernia repair      Family History  Problem Relation Age of Onset  . Anesthesia problems Neg Hx     History  Substance Use Topics  . Smoking status: Never Smoker   . Smokeless tobacco: Never Used  . Alcohol Use: No    OB History   Grav Para Term Preterm Abortions TAB SAB Ect Mult Living   0 0 0 0 0 0 0 0 0 0       Review of Systems  Constitutional: Positive for activity change.  HENT: Negative.   Respiratory: Negative.   Cardiovascular: Negative.   Gastrointestinal: Negative.   Genitourinary: Negative.   Skin:       As per above    Allergies  Oxycodone  Home Medications   Current Outpatient Rx  Name  Route  Sig  Dispense  Refill  . DICLOFENAC PO   Oral   Take by mouth.         . nystatin (MYCOSTATIN) powder   Topical   Apply topically 4 (four) times daily.   30 g   0   . traMADol (ULTRAM) 50 MG tablet   Oral   Take 1 tablet (50 mg total) by mouth every  6 (six) hours as needed for pain.   20 tablet   0     BP 128/88  Pulse 82  Temp(Src) 98.6 F (37 C) (Oral)  Resp 17  SpO2 98%  Physical Exam  Nursing note and vitals reviewed. Constitutional: She is oriented to person, place, and time. She appears well-developed and well-nourished. No distress.  Pulmonary/Chest: Effort normal. No respiratory distress.  Abdominal: Soft.  The umbilical mass appears as if given the photograph was taken of her last visit. There is a trickle of light bleeding. The mass is purplish and tender. No signs of bacterial infection. No erythema or lymphangitis. No purulent exudates. There is an odor distinctive of Candida type infection. The mass is solid, not cystic or fluctuant.  Musculoskeletal: She exhibits no edema.  Neurological: She is alert and oriented to person, place, and time. She exhibits normal muscle tone.  Skin: Skin is warm and dry. No erythema.    ED Course  Procedures (including critical care time)  Labs Reviewed - No data to display No results found.   1. Umbilical mass       MDM  After reviewing the notes and the photograph of her last visit there appears to be no  significant change in appearance. The mass is tender and there is a very slow,light bleeding. Silver nitrate was placed around the area it seems to have a source of bleeding in an attempt to at least one of bleeding down. Although no signs of bacterial infection there was an odor distinctive of a yeast infection. She may use nystatin powder as prescribed to 4 times a day. Do not use tissue paper use calls obtained from the pharmacy to place over the mass. See her surgeon as scheduled at the end of the month. 6 medical attention for any new problems or worsening. Tramadol 50 mg Q6 hours when necessary pain. A bulky dressing was applied and the patient was advised to change it once or twice daily as needed.        Hayden Rasmussen, NP 08/04/12 1104  Hayden Rasmussen, NP 08/04/12  1105

## 2012-08-11 ENCOUNTER — Encounter (HOSPITAL_COMMUNITY): Payer: Self-pay | Admitting: Pharmacy Technician

## 2012-08-15 ENCOUNTER — Encounter (HOSPITAL_COMMUNITY): Payer: Self-pay

## 2012-08-15 ENCOUNTER — Encounter (HOSPITAL_COMMUNITY)
Admission: RE | Admit: 2012-08-15 | Discharge: 2012-08-15 | Disposition: A | Discharge status: Home / Self Care | Payer: No Typology Code available for payment source | Source: Ambulatory Visit | Attending: Surgery | Admitting: Surgery

## 2012-08-15 HISTORY — DX: Other complications of anesthesia, initial encounter: T88.59XA

## 2012-08-15 HISTORY — DX: Adverse effect of unspecified anesthetic, initial encounter: T41.45XA

## 2012-08-15 LAB — CBC
HCT: 39.4 % (ref 36.0–46.0)
MCH: 28.3 pg (ref 26.0–34.0)
MCV: 79.8 fL (ref 78.0–100.0)
RBC: 4.94 MIL/uL (ref 3.87–5.11)
WBC: 5.9 10*3/uL (ref 4.0–10.5)

## 2012-08-15 NOTE — Pre-Procedure Instructions (Signed)
Breigh Annett  08/15/2012  Your procedure is scheduled on:  MONDAY, JUNE 30TH.  Report to [facility] at 5:30 AM. Enter through the Main Entrance, take  [place] Elevators to 3rd Floor- Short Stay.  Call this number if you have problems the morning of surgery: [phone]   Remember:   Do not eat food or drink liquids after midnight.   Take these medicines the morning of surgery with A SIP OF WATER: None Stop taking Aspirin, Coumadin, Plavix, Effient and Herbal medications.  Do not take any NSAIDs [id number] (VOLTAREN)   Ibuprofen,  Advil,Naproxen or any medication containing Aspirin.   Do not wear jewelry, make-up or nail polish.  Do not wear lotions, powders, or perfumes. You may wear deodorant.  Do not shave 48 hours prior to surgery.   Do not bring valuables to the hospital.  [facility] is not responsible for any belongings or valuables.  Contacts, dentures or bridgework may not be worn into surgery.  Leave suitcase in the car. After surgery it may be brought to your room.  For patients admitted to the hospital, checkout time is 11:00 AM the day of discharge.   Patients discharged the day of surgery will not be allowed to drive home.  Name and phone number of your driver: -   Special Instructions: Shower using CHG 2 nights before surgery and the night before surgery.  If you shower the day of surgery use CHG.  Use special wash - you have one bottle of CHG for all showers.  You should use approximately 1/3 of the bottle for each shower.   Please read over the following fact sheets that you were given: Pain Booklet, Coughing and Deep Breathing and Surgical Site Infection Prevention

## 2012-08-20 MED ORDER — CEFAZOLIN SODIUM-DEXTROSE 2-3 GM-% IV SOLR
2.0000 g | INTRAVENOUS | Status: AC
  Administered 2012-08-21: 2 g via INTRAVENOUS
  Filled 2012-08-20: qty 50

## 2012-08-20 NOTE — H&P (Signed)
Anne [name] is an 42 y.o. female.   Chief Complaint: chronic umbilical cyst  HPI: she has has recurrent cysts with infections and abscess at her umbilicus for more than a year.  She has been on multiple antibiotics and has had I and D's in the past as well.  She now presents for definitive surgical therapy.   She is currently without complaints  Past Medical History  Diagnosis Date  . H/O: hysterectomy   . Hernia, umbilical   . Hypertension 01/08/2011  . Complication of anesthesia     Headache after anesthesia in [place]- also did not wake up for 5 hours,.  . Leg wound, left     history as a child, was hospitalized for  6 months, Left leg now much smaller than right , painful at times    Past Surgical History  Procedure Laterality Date  . Abdominal hysterectomy    . Hernia repair      Family History  Problem Relation Age of Onset  . Anesthesia problems Neg Hx    Social History:  reports that she has never smoked. She has never used smokeless tobacco. She reports that she does not drink alcohol or use illicit drugs.  Allergies:  Allergies  Allergen Reactions  . Oxycodone Nausea And Vomiting    dizzyness     No prescriptions prior to admission    No results found for this or any previous visit (from the past 48 hour(s)). No results found.  Review of Systems  All other systems reviewed and are negative.    There were no vitals taken for this visit. Physical Exam  Constitutional: She is oriented to person, place, and time. She appears well-developed and well-nourished. No distress.  HENT:  Head: Normocephalic and atraumatic.  Eyes: Conjunctivae are normal. Pupils are equal, round, and reactive to light.  Neck: Normal range of motion.  Cardiovascular: Normal rate, regular rhythm and normal heart sounds.   Respiratory: Effort normal and breath sounds normal. No respiratory distress.  GI: Soft. Bowel sounds are normal.  Chronic scarring and cyst at the umbilicus   Musculoskeletal: Normal range of motion.  Neurological: She is alert and oriented to person, place, and time.  Skin: Skin is warm and dry.  Psychiatric: Her behavior is normal. Judgment normal.     Assessment/Plan Chronic umbilical cyst  Will now attempt surgical excision of the umbilical cyst.  She understands that I may have to remove her umbilicus totally.  The other risks include bleeding, infection, hernia formation, need for further surgery, recurrence, etc.  She agrees to proceed.  Braxon Suder A 08/20/2012, 8:35 PM

## 2012-08-21 ENCOUNTER — Encounter (HOSPITAL_COMMUNITY): Payer: Self-pay | Admitting: Critical Care Medicine

## 2012-08-21 ENCOUNTER — Encounter (HOSPITAL_COMMUNITY)
Admission: RE | Disposition: A | Discharge status: Home / Self Care | Payer: Self-pay | Source: Ambulatory Visit | Attending: Surgery

## 2012-08-21 ENCOUNTER — Encounter (HOSPITAL_COMMUNITY): Payer: Self-pay | Admitting: *Deleted

## 2012-08-21 ENCOUNTER — Ambulatory Visit (HOSPITAL_COMMUNITY)
Admission: RE | Admit: 2012-08-21 | Discharge: 2012-08-21 | Disposition: A | Discharge status: Home / Self Care | Payer: No Typology Code available for payment source | Source: Ambulatory Visit | Attending: Surgery | Admitting: Surgery

## 2012-08-21 ENCOUNTER — Ambulatory Visit (HOSPITAL_COMMUNITY): Payer: No Typology Code available for payment source | Admitting: Critical Care Medicine

## 2012-08-21 DIAGNOSIS — N808 Other endometriosis: Secondary | ICD-10-CM

## 2012-08-21 DIAGNOSIS — Q898 Other specified congenital malformations: Secondary | ICD-10-CM | POA: Insufficient documentation

## 2012-08-21 HISTORY — PX: MASS EXCISION: SHX2000

## 2012-08-21 SURGERY — EXCISION MASS
Anesthesia: General | Site: Abdomen | Wound class: Clean

## 2012-08-21 MED ORDER — CEPHALEXIN 500 MG PO CAPS: 500.0000 mg | ORAL_CAPSULE | Freq: Three times a day (TID) | ORAL | Status: DC

## 2012-08-21 MED ORDER — BUPIVACAINE-EPINEPHRINE PF 0.25-1:200000 % IJ SOLN
INTRAMUSCULAR | Status: AC
  Filled 2012-08-21: qty 30

## 2012-08-21 MED ORDER — PROPOFOL 10 MG/ML IV BOLUS: INTRAVENOUS | Status: DC | PRN

## 2012-08-21 MED ORDER — 0.9 % SODIUM CHLORIDE (POUR BTL) OPTIME
TOPICAL | Status: DC | PRN
  Administered 2012-08-21: 1000 mL

## 2012-08-21 MED ORDER — FENTANYL CITRATE 0.05 MG/ML IJ SOLN
INTRAMUSCULAR | Status: AC
  Administered 2012-08-21: 50 ug via INTRAVENOUS
  Filled 2012-08-21: qty 2

## 2012-08-21 MED ORDER — MIDAZOLAM HCL 5 MG/5ML IJ SOLN
INTRAMUSCULAR | Status: DC | PRN
  Administered 2012-08-21: 1 mg via INTRAVENOUS

## 2012-08-21 MED ORDER — ONDANSETRON HCL 4 MG/2ML IJ SOLN
INTRAMUSCULAR | Status: DC | PRN
  Administered 2012-08-21: 4 mg via INTRAVENOUS

## 2012-08-21 MED ORDER — HYDROCODONE-ACETAMINOPHEN 5-325 MG PO TABS: 1.0000 | ORAL_TABLET | ORAL | Status: DC | PRN

## 2012-08-21 MED ORDER — LACTATED RINGERS IV SOLN
INTRAVENOUS | Status: DC | PRN
  Administered 2012-08-21: 07:00:00 via INTRAVENOUS

## 2012-08-21 MED ORDER — PROPOFOL 10 MG/ML IV BOLUS
INTRAVENOUS | Status: DC | PRN
  Administered 2012-08-21: 180 mg via INTRAVENOUS

## 2012-08-21 MED ORDER — ONDANSETRON HCL 4 MG/2ML IJ SOLN: 4.0000 mg | Freq: Four times a day (QID) | INTRAMUSCULAR | Status: DC | PRN

## 2012-08-21 MED ORDER — LIDOCAINE HCL (CARDIAC) 20 MG/ML IV SOLN
INTRAVENOUS | Status: DC | PRN
  Administered 2012-08-21: 80 mg via INTRAVENOUS

## 2012-08-21 MED ORDER — BUPIVACAINE-EPINEPHRINE 0.25% -1:200000 IJ SOLN
INTRAMUSCULAR | Status: DC | PRN
  Administered 2012-08-21: 20 mL

## 2012-08-21 MED ORDER — FENTANYL CITRATE 0.05 MG/ML IJ SOLN
25.0000 ug | INTRAMUSCULAR | Status: DC | PRN
  Administered 2012-08-21 (×2): 25 ug via INTRAVENOUS

## 2012-08-21 MED ORDER — FENTANYL CITRATE 0.05 MG/ML IJ SOLN
INTRAMUSCULAR | Status: DC | PRN
  Administered 2012-08-21: 50 ug via INTRAVENOUS

## 2012-08-21 SURGICAL SUPPLY — 44 items
BENZOIN TINCTURE PRP APPL 2/3 (GAUZE/BANDAGES/DRESSINGS) IMPLANT
BLADE SURG 10 STRL SS (BLADE) ×2 IMPLANT
BLADE SURG 15 STRL LF DISP TIS (BLADE) ×1 IMPLANT
BLADE SURG 15 STRL SS (BLADE) ×1
CANISTER SUCTION 2500CC (MISCELLANEOUS) ×2 IMPLANT
CLOTH BEACON ORANGE TIMEOUT ST (SAFETY) ×2 IMPLANT
COVER SURGICAL LIGHT HANDLE (MISCELLANEOUS) ×2 IMPLANT
DECANTER SPIKE VIAL GLASS SM (MISCELLANEOUS) IMPLANT
DRAPE LAPAROSCOPIC ABDOMINAL (DRAPES) ×2 IMPLANT
DRAPE PED LAPAROTOMY (DRAPES) IMPLANT
DRAPE UTILITY 15X26 (DRAPE) ×4 IMPLANT
ELECT CAUTERY BLADE 6.4 (BLADE) ×2 IMPLANT
ELECT REM PT RETURN 9FT ADLT (ELECTROSURGICAL) ×2
ELECTRODE REM PT RTRN 9FT ADLT (ELECTROSURGICAL) ×1 IMPLANT
GLOVE BIO SURGEON STRL SZ7.5 (GLOVE) ×2 IMPLANT
GLOVE BIOGEL PI IND STRL 7.5 (GLOVE) ×1 IMPLANT
GLOVE BIOGEL PI INDICATOR 7.5 (GLOVE) ×1
GLOVE SURG SIGNA 7.5 PF LTX (GLOVE) ×2 IMPLANT
GOWN STRL NON-REIN LRG LVL3 (GOWN DISPOSABLE) ×2 IMPLANT
GOWN STRL REIN XL XLG (GOWN DISPOSABLE) ×2 IMPLANT
KIT BASIN OR (CUSTOM PROCEDURE TRAY) ×2 IMPLANT
KIT ROOM TURNOVER OR (KITS) ×2 IMPLANT
NEEDLE HYPO 25X1 1.5 SAFETY (NEEDLE) ×2 IMPLANT
NS IRRIG 1000ML POUR BTL (IV SOLUTION) ×2 IMPLANT
PACK SURGICAL SETUP 50X90 (CUSTOM PROCEDURE TRAY) ×2 IMPLANT
PAD ARMBOARD 7.5X6 YLW CONV (MISCELLANEOUS) ×2 IMPLANT
PENCIL BUTTON HOLSTER BLD 10FT (ELECTRODE) ×2 IMPLANT
SPECIMEN JAR SMALL (MISCELLANEOUS) ×2 IMPLANT
SPONGE GAUZE 4X4 12PLY (GAUZE/BANDAGES/DRESSINGS) ×2 IMPLANT
SPONGE LAP 18X18 X RAY DECT (DISPOSABLE) ×2 IMPLANT
STRIP CLOSURE SKIN 1/2X4 (GAUZE/BANDAGES/DRESSINGS) IMPLANT
SUT ETHILON 3 0 PS 1 (SUTURE) ×2 IMPLANT
SUT MNCRL AB 4-0 PS2 18 (SUTURE) ×2 IMPLANT
SUT VIC AB 2-0 CT1 27 (SUTURE) ×1
SUT VIC AB 2-0 CT1 TAPERPNT 27 (SUTURE) ×1 IMPLANT
SUT VIC AB 3-0 SH 27 (SUTURE) ×2
SUT VIC AB 3-0 SH 27XBRD (SUTURE) ×2 IMPLANT
SYR BULB 3OZ (MISCELLANEOUS) ×2 IMPLANT
SYR CONTROL 10ML LL (SYRINGE) ×2 IMPLANT
TAPE CLOTH SURG 4X10 WHT LF (GAUZE/BANDAGES/DRESSINGS) ×2 IMPLANT
TOWEL OR 17X24 6PK STRL BLUE (TOWEL DISPOSABLE) ×2 IMPLANT
TOWEL OR 17X26 10 PK STRL BLUE (TOWEL DISPOSABLE) ×2 IMPLANT
TUBE CONNECTING 12X1/4 (SUCTIONS) IMPLANT
YANKAUER SUCT BULB TIP NO VENT (SUCTIONS) ×2 IMPLANT

## 2012-08-21 NOTE — Op Note (Signed)
NAMEDARCIE, MELLONE NO.:  [id number]  MEDICAL RECORD NO.:  [id number]  LOCATION:  [facility]                         FACILITY:  [facility]  PHYSICIAN:  Abigail Miyamoto, M.D. DATE OF BIRTH:  [birth year]  DATE OF PROCEDURE:  08/21/2012 DATE OF DISCHARGE:                              OPERATIVE REPORT   PREOPERATIVE DIAGNOSIS:  Chronic umbilical cyst.  POSTOPERATIVE DIAGNOSIS:  Chronic umbilical cyst.  PROCEDURE:  Excision of chronic umbilical cyst.  SURGEON:  Abigail Miyamoto, M.D.  ANESTHESIA:  General and 0.5% Marcaine.  ESTIMATED BLOOD LOSS:  Minimal.  INDICATIONS:  This is a 42 year old female, who presents with chronic drainage from a cyst in her umbilicus.  She has had I and D procedures and had chronic pain from this area.  Decision was made to proceed with excision of the cyst.  PROCEDURE IN DETAIL:  The patient was brought to the operating room, identified as Anne [name].  She was placed supine on the operating room table and general anesthesia was induced.  Her abdomen was then prepped and draped in the usual sterile fashion.  I anesthetized the skin surrounding the chronic scar with Marcaine.  I then made a longitudinal incision, going right through the middle of the umbilicus to excise the chronic scar and draining area.  I took this down to just above the fascia with electrocautery.  I excised the cyst and the chronic scar tissue in its entirety.  I then thoroughly irrigated the wound with normal saline.  I anesthetized the wound further with Marcaine.  I then closed the incision with interrupted 3-0 Vicryl sutures and then interrupted 3-0 nylon sutures.  Gauze and tape were then applied.  The patient tolerated the procedure well.  All counts were correct at the end of the procedure.  The patient was then extubated in the operating room and taken in stable condition to recovery room.     Abigail Miyamoto, M.D.     DB/MEDQ  D:  08/21/2012   T:  08/21/2012  Job:  [id number]

## 2012-08-21 NOTE — Anesthesia Postprocedure Evaluation (Signed)
Anesthesia Post Note  Patient: Anne [name]  Procedure(s) Performed: Procedure(s) (LRB): Excision of umbilical cyst, removal of umbilicus (N/A)  Anesthesia type: General  Patient location: PACU  Post pain: Pain level controlled and Adequate analgesia  Post assessment: Post-op Vital signs reviewed, Patient's Cardiovascular Status Stable, Respiratory Function Stable, Patent Airway and Pain level controlled  Last Vitals:  Filed Vitals:   08/21/12 0925  BP:   Pulse: 71  Temp: 36.6 C  Resp: 17    Post vital signs: Reviewed and stable  Level of consciousness: awake, alert  and oriented  Complications: No apparent anesthesia complications

## 2012-08-21 NOTE — Interval H&P Note (Signed)
History and Physical Interval Note: no change in H and P  08/21/2012 6:59 AM  Anne [name]  has presented today for surgery, with the diagnosis of chronic umbilical cyst  The various methods of treatment have been discussed with the patient and family. After consideration of risks, benefits and other options for treatment, the patient has consented to  Procedure(s): Excision of umbilical cyst possible removal of umbilicus (N/A) as a surgical intervention .  The patient's history has been reviewed, patient examined, no change in status, stable for surgery.  I have reviewed the patient's chart and labs.  Questions were answered to the patient's satisfaction.     [name]

## 2012-08-21 NOTE — Anesthesia Preprocedure Evaluation (Addendum)
Anesthesia Evaluation  Patient identified by MRN, date of birth, ID band Patient awake    Reviewed: Allergy & Precautions, H&P , NPO status , Patient's Chart, lab work & pertinent test results  Airway Mallampati: II  Neck ROM: full    Dental  (+) Dental Advisory Given   Pulmonary          Cardiovascular     Neuro/Psych    GI/Hepatic   Endo/Other    Renal/GU      Musculoskeletal   Abdominal   Peds  Hematology   Anesthesia Other Findings   Reproductive/Obstetrics                          Anesthesia Physical Anesthesia Plan  ASA: I  Anesthesia Plan: General   Post-op Pain Management:    Induction: Intravenous  Airway Management Planned: LMA  Additional Equipment:   Intra-op Plan:   Post-operative Plan:   Informed Consent: I have reviewed the patients History and Physical, chart, labs and discussed the procedure including the risks, benefits and alternatives for the proposed anesthesia with the patient or authorized representative who has indicated his/her understanding and acceptance.     Plan Discussed with: CRNA, Anesthesiologist and Surgeon  Anesthesia Plan Comments:         Anesthesia Quick Evaluation

## 2012-08-21 NOTE — Op Note (Signed)
Excision of umbilical cyst, removal of umbilicus  Procedure Note  Anne [name] 08/21/2012   Pre-op Diagnosis: chronic umbilical cyst     Post-op Diagnosis: same  Procedure(s): Excision of umbilical cyst  Surgeon(s): Shelly Rubenstein, MD  Anesthesia: General  Staff:  Circulator: Wilhelmenia Blase, RN Scrub Person: Maureen Ralphs, RN  Estimated Blood Loss: Minimal               Specimens: sent to path          [facility] A   Date: 08/21/2012  Time: 8:04 AM

## 2012-08-21 NOTE — Preoperative (Signed)
Beta Blockers   Reason not to administer Beta Blockers:Not Applicable 

## 2012-08-21 NOTE — Transfer of Care (Signed)
Immediate Anesthesia Transfer of Care Note  Patient: Anne [name]  Procedure(s) Performed: Procedure(s): Excision of umbilical cyst, removal of umbilicus (N/A)  Patient Location: PACU  Anesthesia Type:General  Level of Consciousness: awake, alert , oriented and sedated  Airway & Oxygen Therapy: Patient Spontanous Breathing and Patient connected to nasal cannula oxygen  Post-op Assessment: Report given to PACU RN and Post -op Vital signs reviewed and stable  Post vital signs: Reviewed and stable  Complications: No apparent anesthesia complications

## 2012-08-22 ENCOUNTER — Encounter (HOSPITAL_COMMUNITY): Payer: Self-pay | Admitting: Surgery

## 2012-08-29 ENCOUNTER — Ambulatory Visit (INDEPENDENT_AMBULATORY_CARE_PROVIDER_SITE_OTHER): Payer: PRIVATE HEALTH INSURANCE | Admitting: Surgery

## 2012-08-29 ENCOUNTER — Encounter (INDEPENDENT_AMBULATORY_CARE_PROVIDER_SITE_OTHER): Payer: Self-pay | Admitting: Surgery

## 2012-08-29 VITALS — BP 122/82 | HR 88 | Temp 97.2°F | Resp 16 | Ht <= 58 in | Wt 144.0 lb

## 2012-08-29 DIAGNOSIS — Z09 Encounter for follow-up examination after completed treatment for conditions other than malignant neoplasm: Secondary | ICD-10-CM

## 2012-08-29 NOTE — Progress Notes (Signed)
Subjective:     Patient ID: Anne [name], female   DOB: Dec 05, 1970, 42 y.o.   MRN: [id number]  HPI She is here for first postop visit status post excision of umbilical cyst. She has had moderate drainage of seroma fluid from the incision and moderate pain  Review of Systems     Objective:   Physical Exam On exam, the sutures are intact there is a small opening was seroma fluid. 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 I think she will continue to improve. I will have her come back next week to remove the sutures. I will write for more hydrocodone

## 2012-09-05 ENCOUNTER — Encounter (INDEPENDENT_AMBULATORY_CARE_PROVIDER_SITE_OTHER): Payer: PRIVATE HEALTH INSURANCE | Admitting: General Surgery

## 2012-09-05 ENCOUNTER — Telehealth (INDEPENDENT_AMBULATORY_CARE_PROVIDER_SITE_OTHER): Payer: Self-pay | Admitting: General Surgery

## 2012-09-05 NOTE — Telephone Encounter (Signed)
Patient came in today for suture removal at her belly buttock site. I removed all of the suture and placed a dry gauze over the wound. I told the patient that she need to keep a dry gauze over the wound a few more days due where her jean are placed against her wound. I told the patient that if she needs anything that she can call back to the office

## 2012-11-07 ENCOUNTER — Encounter (INDEPENDENT_AMBULATORY_CARE_PROVIDER_SITE_OTHER): Payer: Self-pay | Admitting: Surgery

## 2012-11-07 ENCOUNTER — Ambulatory Visit (INDEPENDENT_AMBULATORY_CARE_PROVIDER_SITE_OTHER): Payer: PRIVATE HEALTH INSURANCE | Admitting: Surgery

## 2012-11-07 VITALS — BP 130/90 | HR 82 | Resp 16 | Ht 59.5 in | Wt 148.0 lb

## 2012-11-07 DIAGNOSIS — Z09 Encounter for follow-up examination after completed treatment for conditions other than malignant neoplasm: Secondary | ICD-10-CM

## 2012-11-07 NOTE — Progress Notes (Signed)
Subjective:     Patient ID: Anne [name], female   DOB: February 06, 1971, 42 y.o.   MRN: [id number]  HPI She was here today because of discomfort at her umbilicus. She denies any drainage.  Review of Systems     Objective:   Physical Exam On exam the incision is well-healed. 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 She may resume normal activity. I will see her as needed. I encouraged her to see her gynecologist

## 2012-11-15 ENCOUNTER — Ambulatory Visit (INDEPENDENT_AMBULATORY_CARE_PROVIDER_SITE_OTHER): Payer: No Typology Code available for payment source | Admitting: Obstetrics & Gynecology

## 2012-11-15 ENCOUNTER — Encounter: Payer: Self-pay | Admitting: Obstetrics & Gynecology

## 2012-11-15 VITALS — BP 132/94 | HR 96 | Temp 97.0°F | Ht 59.0 in | Wt 147.1 lb

## 2012-11-15 DIAGNOSIS — Z603 Acculturation difficulty: Secondary | ICD-10-CM

## 2012-11-15 DIAGNOSIS — Z789 Other specified health status: Secondary | ICD-10-CM

## 2012-11-15 DIAGNOSIS — Z90710 Acquired absence of both cervix and uterus: Secondary | ICD-10-CM

## 2012-11-15 DIAGNOSIS — Z609 Problem related to social environment, unspecified: Secondary | ICD-10-CM

## 2012-11-15 DIAGNOSIS — N808 Other endometriosis: Secondary | ICD-10-CM

## 2012-11-15 DIAGNOSIS — N80C2 Endometriosis of the umbilicus: Secondary | ICD-10-CM

## 2012-11-15 NOTE — Progress Notes (Signed)
Patient sent from Dr Rayburn Ma, unsure why she was sent here or what her appt is for

## 2012-11-15 NOTE — Patient Instructions (Signed)
Dr. April Manson  [phone]   L'endomtriose  L'endomtriose est Leanor Rubenstein qui se produit lors de l'endomtre (muqueuse de l'utrus) est dplace en dehors de son emplacement normal. Il peut se produire dans de nombreux endroits  proximit de l'utrus (ventre), mais gnralement sur ??les ovaires, les trompes de [place], le vagin (naissance canal) et de l'intestin situe  proximit de l'utrus. Parce que les marcages de l'utrus (de) expulse sa doublure chaque mois (rgles), il ya un saignement partout o le tissu endomtrial se trouve.  Symptmes  Souvent, il n'y a pas de symptmes. Cependant, parce que le sang est irritant pour les tissus normalement pas exposs  elle, lorsque les symptmes apparaissent, ils varient en fonction de l'emplacement de l'endomtre dplac. Les symptmes comprennent Northrop Grumman et des [employer] Company. Les priodes peuvent tre plus lourdes et les rapports peuvent tre douloureux. L'infertilit peut tre prsent. Vous pouvez avoir tous ces symptmes  un moment ou  un autre ou que vous ayez mois avec aucun symptme. Bien que les symptmes se produisent principalement pendant les rgles, ils peuvent se produire  mi-cycle ainsi, et de mettre fin [employer].  DIAGNOSTIC  Votre fournisseur de [employer] de sang et analyse d'urine (analyse d'urine) pour Advice worker d'autres conditions. Un autre test de mon com? chographie est une procdure indolore qui utilise des [employer] pour [employer] "image" de sonogramme de tissu anormal qui pourrait tre l'endomtriose. Si vos selles sont douloureuses autour de vos priodes, votre fournisseur de Risk analyst un lavement baryt (un X-ray de la partie infrieure du clon), pour tenter de trouver la source de Nurse, adult. Ceci est parfois confirme par laparoscopie. La laparoscopie est une procdure o votre fournisseur de soins se penche sur l'abdomen avec un laparoscope (un  petit crayon de tlescope de taille). Votre fournisseur de [employer] prendre un petit morceau de tissu (biopsie) de n'importe quel tissu anormal pour confirmer ou documenter votre problme. Ces tissus sont envoys au laboratoire et un pathologiste les regarde au microscope pour donner un diagnostic microscopique.  TRAITEMENT  Une fois que le diagnostic est fait, elle peut tre traite par la destruction du tissu de l'endomtre dplac en utilisant la chaleur (diathermie), laser, dcoupe (excision), ou des moyens chimiques. Il peut galement tre trait avec une thrapie hormonale. Lorsque vous utilisez les rgles de thrapie hormonale sont limins, liminant ainsi l'exposition [employer]Mc[employer]ee  sang par le tissu de l'endomtre dplac. Seulement dans les cas graves est-il ncessaire d'effectuer une hystrectomie avec possible des [place].  INSTRUCTIONS DE SOINS  DOMICILE  Ne prenez que over-the-counter ou de prescription de mdicaments pour [employer] Claiborne, de l'inconfort ou de la fivre comme indiqu par votre fournisseur de Ector.  vitez les activits qui produisent la [place], y compris une relation sexuelle physique.  Ne prenez pas d'aspirine, car cela pourrait augmenter les saignements quand il n'est pas sur la thrapie hormonale.  Consultez votre fournisseur de [employer] ou les problmes ne sont pas contrls par Camera operator.  Consulter immdiatement SOINS SI:  Votre douleur est svre et ne rpond pas  la mdecine de Armed forces logistics/support/administrative officer.  Vous dveloppez des [place] et vomissements svres, ou que vous ne pouvez pas garder les aliments vers le bas.  Votre douleur se localise dans la partie infrieure droite de l'abdomen (possible appendicite).  Vous avez un gonflement ou une augmentation de Dealer l'abdomen.  Vous avez de la fivre.  Vous voyez du sang dans vos selles.  Il faut s'assurer:  Comprendre ces instructions.  Va regarder votre tat de sant.  Recevront de Cel[employer] Corporation de  suite si vous ne faites pas Designer, fashion/clothing.  Document de Sortie: 15/01/2000 Document de rvision: 12/26/2011 Document de rvision: 09/27/2007  ExitCare information des patients  2014 [place], [place].    Endometriosis Endometriosis is a disease that occurs when the endometrium (lining of the uterus) is misplaced outside of its normal location. It may occur in many locations close to the uterus (womb), but commonly on the ovaries, fallopian tubes, vagina (birth canal) and bowel located close to the uterus. Because the uterus sloughs (expels) its lining every month (menses), there is bleeding whereever the endometrial tissue is located. SYMPTOMS  Often there are no symptoms. However, because blood is irritating to tissues not normally exposed to it, when symptoms occur they vary with the location of the misplaced endometrium. Symptoms often include back and abdominal pain. Periods may be heavier and intercourse may be painful. Infertility may be present. You may have all of these symptoms at one time or another or you may have months with no symptoms at all. Although the symptoms occur mainly during menses, they can occur mid-cycle as well, and usually terminate with menopause. DIAGNOSIS  Your caregiver may recommend a blood test and urine test (urinalysis) to help rule out other conditions. Another common test is ultrasound, a painless procedure that uses sound waves to make a sonogram "picture" of abnormal tissue that could be endometriosis. If your bowel movements are painful around your periods, your caregiver may advise a barium enema (an X-ray of the lower bowel), to try to find the source of your pain. This is sometimes confirmed by laparoscopy. Laparoscopy is a procedure where your caregiver looks into your abdomen with a laparoscope (a small pencil sized telescope). Your caregiver may take a tiny piece of tissue (biopsy) from any abnormal tissue to confirm or document your problem. These tissues  are sent to the lab and a pathologist looks at them under the microscope to give a microscopic diagnosis. TREATMENT  Once the diagnosis is made, it can be treated by destruction of the misplaced endometrial tissue using heat (diathermy), laser, cutting (excision), or chemical means. It may also be treated with hormonal therapy. When using hormonal therapy menses are eliminated, therefore eliminating the monthly exposure to blood by the misplaced endometrial tissue. Only in severe cases is it necessary to perform a hysterectomy with possible removal of the ovaries. HOME CARE INSTRUCTIONS   Only take over-the-counter or prescription medicines for pain, discomfort, or fever as directed by your caregiver.  Avoid activities that produce pain, including a physical sexual relationship.  Do not take aspirin as this may increase bleeding when not on hormonal therapy.  See your caregiver for pain or problems not controlled with treatment. SEEK IMMEDIATE MEDICAL CARE IF:   Your pain is severe and is not responding to pain medicine.  You develop severe nausea and vomiting, or you cannot keep foods down.  Your pain localizes to the right lower part of your abdomen (possible appendicitis).  You have swelling or increasing pain in the abdomen.  You have a fever.  You see blood in your stool. MAKE SURE YOU:   Understand these instructions.  Will watch your condition.  Will get help right away if you are not doing well or get worse. Document Released: 02/06/2000 Document Revised: 05/03/2011 Document Reviewed: 09/27/2007 [facility] Patient Information 2014 [place], [place].

## 2012-11-15 NOTE — Progress Notes (Signed)
GYNECOLOGY CLINIC ENCOUNTER NOTE  History:  42 y.o. G0 here today for discussion of endometriosis found on pathologic analysis of an umbilical mass resected by general Surgery on 08/21/12.  Patient is French-speaking only, [place] interpreter present for this encounter.  Of note, patient had a total hysterectomy many years ago; she does have her adnexa in place.  Patient has been seen in this clinic numerous times for recurrent physiologic ovarian cysts that resolve on their own.   She denies any current pelvic pain, just pain around the surgical site.    The following portions of the patient's history were reviewed and updated as appropriate: allergies, current medications, past family history, past medical history, past social history, past surgical history and problem list.  Review of Systems:  Pertinent items are noted in HPI.  Objective:  BP 132/94  Pulse 96  Temp(Src) 97 F (36.1 C) (Oral)  Ht 4\' 11"  (1.499 m)  Wt 147 lb 1.6 oz (66.724 kg)  BMI 29.69 kg/m2 Physical Exam deferred  08/21/12 Umbilicus, biopsy: ENDOMETRIOSIS. NO EVIDENCE OF MALIGNANCY.  Assessment & Plan:  Patient was assured that no further intervention was needed at this point unless she was symptomatic.  She has already had a hysterectomy; if she does have pain, the management options would be hormonal therapy vs Lupron.  Performing a BSO will be last resort as this will lead to surgical menopause and it related comorbidities, and could lead to need for HRT.  Patient denies any pain, she will let us know when she has any pain. She was to return for any GYN concerns.    At the end of the encounter, patient reported an interest in getting her oocytes extracted as she is interested in pregnancy using a gestational carrier. She was given the number for Dr. April Manson, REI who will give her more information.  Anne Collins, MD, FACOG Attending Obstetrician & Gynecologist Faculty Practice, [facility]

## 2012-11-16 DIAGNOSIS — Z789 Other specified health status: Secondary | ICD-10-CM | POA: Insufficient documentation

## 2012-11-16 DIAGNOSIS — Z758 Other problems related to medical facilities and other health care: Secondary | ICD-10-CM | POA: Insufficient documentation

## 2013-04-20 ENCOUNTER — Ambulatory Visit: Payer: No Typology Code available for payment source | Attending: Internal Medicine | Admitting: Internal Medicine

## 2013-04-20 VITALS — BP 139/92 | HR 93 | Temp 98.0°F | Resp 16 | Ht <= 58 in | Wt 148.0 lb

## 2013-04-20 DIAGNOSIS — Z0000 Encounter for general adult medical examination without abnormal findings: Secondary | ICD-10-CM

## 2013-04-20 DIAGNOSIS — I10 Essential (primary) hypertension: Secondary | ICD-10-CM | POA: Insufficient documentation

## 2013-04-20 DIAGNOSIS — Z90710 Acquired absence of both cervix and uterus: Secondary | ICD-10-CM | POA: Insufficient documentation

## 2013-04-20 DIAGNOSIS — K1370 Unspecified lesions of oral mucosa: Secondary | ICD-10-CM | POA: Insufficient documentation

## 2013-04-20 LAB — CBC WITH DIFFERENTIAL/PLATELET
Basophils Absolute: 0 10*3/uL (ref 0.0–0.1)
Basophils Relative: 0 % (ref 0–1)
EOS ABS: 0.3 10*3/uL (ref 0.0–0.7)
EOS PCT: 4 % (ref 0–5)
HCT: 40.2 % (ref 36.0–46.0)
Hemoglobin: 13.4 g/dL (ref 12.0–15.0)
LYMPHS ABS: 2.8 10*3/uL (ref 0.7–4.0)
Lymphocytes Relative: 42 % (ref 12–46)
MCH: 27.4 pg (ref 26.0–34.0)
MCHC: 33.3 g/dL (ref 30.0–36.0)
MCV: 82.2 fL (ref 78.0–100.0)
Monocytes Absolute: 0.6 10*3/uL (ref 0.1–1.0)
Monocytes Relative: 9 % (ref 3–12)
Neutro Abs: 3 10*3/uL (ref 1.7–7.7)
Neutrophils Relative %: 45 % (ref 43–77)
PLATELETS: 156 10*3/uL (ref 150–400)
RBC: 4.89 MIL/uL (ref 3.87–5.11)
RDW: 14.7 % (ref 11.5–15.5)
WBC: 6.6 10*3/uL (ref 4.0–10.5)

## 2013-04-20 NOTE — Progress Notes (Signed)
Pt is here to establish care. Pt is here today because one of her teeth has falling out and another one is loose.

## 2013-04-20 NOTE — Progress Notes (Signed)
Patient ID: Anne [name], female   DOB: 11-05-1970, 43 y.o.   MRN: [id number]   CC:  HPI: 43 year old female here for establishing care. The patient hysterectomy as well as a likely cysts removal by general surgery in 6/14. The patient has a history of endometriosis and has been seen at the [facility]. The patient denies any abdominal pain today. She is primarily here to get a dentist referral  Social history nonsmoker nonalcoholic, works in [employer]  Family history negative   Allergies  Allergen Reactions  . Oxycodone Nausea And Vomiting    dizzyness    Past Medical History  Diagnosis Date  . H/O: hysterectomy   . Hernia, umbilical   . Complication of anesthesia     Headache after anesthesia in [place]- also did not wake up for 5 hours,.  . Leg wound, left     history as a child, was hospitalized for  6 months, Left leg now much smaller than right , painful at times   No current outpatient prescriptions on file prior to visit.   No current facility-administered medications on file prior to visit.   Family History  Problem Relation Age of Onset  . Anesthesia problems Neg Hx    History   Social History  . Marital Status: Single    Spouse Name: N/A    Number of Children: N/A  . Years of Education: N/A   Occupational History  . Not on file.   Social History Main Topics  . Smoking status: Never Smoker   . Smokeless tobacco: Never Used  . Alcohol Use: No  . Drug Use: No  . Sexual Activity: Yes    Birth Control/ Protection: Surgical   Other Topics Concern  . Not on file   Social History Narrative  . No narrative on file    Review of Systems  Constitutional: Negative for fever, chills, diaphoresis, activity change, appetite change and fatigue.  HENT: Negative for ear pain, nosebleeds, congestion, facial swelling, rhinorrhea, neck pain, neck stiffness and ear discharge.   Eyes: Negative for pain, discharge, redness, itching and visual disturbance.   Respiratory: Negative for cough, choking, chest tightness, shortness of breath, wheezing and stridor.   Cardiovascular: Negative for chest pain, palpitations and leg swelling.  Gastrointestinal: Negative for abdominal distention.  Genitourinary: Negative for dysuria, urgency, frequency, hematuria, flank pain, decreased urine volume, difficulty urinating and dyspareunia.  Musculoskeletal: Negative for back pain, joint swelling, arthralgias and gait problem.  Neurological: Negative for dizziness, tremors, seizures, syncope, facial asymmetry, speech difficulty, weakness, light-headedness, numbness and headaches.  Hematological: Negative for adenopathy. Does not bruise/bleed easily.  Psychiatric/Behavioral: Negative for hallucinations, behavioral problems, confusion, dysphoric mood, decreased concentration and agitation.    Objective:   Filed Vitals:   04/20/13 1715  BP: 139/92  Pulse: 93  Temp: 98 F (36.7 C)  Resp: 16    Physical Exam  Constitutional: Appears well-developed and well-nourished. No distress.  HENT: Normocephalic. External right and left ear normal. Oropharynx is clear and moist.  Eyes: Conjunctivae and EOM are normal. PERRLA, no scleral icterus.  Neck: Normal ROM. Neck supple. No JVD. No tracheal deviation. No thyromegaly.  CVS: RRR, S1/S2 +, no murmurs, no gallops, no carotid bruit.  Pulmonary: Effort and breath sounds normal, no stridor, rhonchi, wheezes, rales.  Abdominal: Soft. BS +,  no distension, tenderness, rebound or guarding.  Musculoskeletal: Normal range of motion. No edema and no tenderness.  Lymphadenopathy: No lymphadenopathy noted, cervical, inguinal. Neuro: Alert. Normal reflexes, muscle  tone coordination. No cranial nerve deficit. Skin: Skin is warm and dry. No rash noted. Not diaphoretic. No erythema. No pallor.  Psychiatric: Normal mood and affect. Behavior, judgment, thought content normal.   Lab Results  Component Value Date   WBC 5.9  08/15/2012   HGB 14.0 08/15/2012   HCT 39.4 08/15/2012   MCV 79.8 08/15/2012   PLT 159 08/15/2012   Lab Results  Component Value Date   CREATININE 0.73 11/29/2011   BUN 12 11/29/2011   NA 142 11/29/2011   K 3.4* 11/29/2011   CL 107 11/29/2011   CO2 25 11/29/2011    No results found for this basename: HGBA1C   Lipid Panel  No results found for this basename: chol, trig, hdl, cholhdl, vldl, ldlcalc       Assessment and plan:   Patient Active Problem List   Diagnosis Date Noted  . Language barrier, speaks [place] only 11/16/2012  . Endometriosis, umbilicus 11/15/2012  . Abscess or cellulitis of umbilicus 06/21/2012  . Umbilical hernia 01/03/2012  . Mass of abdomen 02/12/2011  . S/P total hysterectomy 01/28/2011  . Recurrent left ovarian complex cyst 01/08/2011  . Hypertension 01/08/2011   Dental pain Patient has 1 lower incisor which has fallen off, one incisor which is wiggling and ready to fall off no apparent reason     Establish care Received flu vaccine last year Diffusing tetanus vaccination We'll schedule the patient for routine mammogram Gynecology referral for routine Pap smear due in July Baseline labs  Follow up in 3 months  The patient was given clear instructions to go to ER or return to medical center if symptoms don't improve, worsen or new problems develop. The patient verbalized understanding. The patient was told to call to get any lab results if not heard anything in the next week.

## 2013-04-21 LAB — COMPLETE METABOLIC PANEL WITH GFR
ALT: 16 U/L (ref 0–35)
AST: 18 U/L (ref 0–37)
Albumin: 4.3 g/dL (ref 3.5–5.2)
Alkaline Phosphatase: 56 U/L (ref 39–117)
BILIRUBIN TOTAL: 0.4 mg/dL (ref 0.2–1.2)
BUN: 11 mg/dL (ref 6–23)
CO2: 28 meq/L (ref 19–32)
CREATININE: 0.64 mg/dL (ref 0.50–1.10)
Calcium: 9.1 mg/dL (ref 8.4–10.5)
Chloride: 105 mEq/L (ref 96–112)
GLUCOSE: 80 mg/dL (ref 70–99)
Potassium: 3.8 mEq/L (ref 3.5–5.3)
Sodium: 140 mEq/L (ref 135–145)
Total Protein: 7.4 g/dL (ref 6.0–8.3)

## 2013-04-21 LAB — LIPID PANEL
CHOLESTEROL: 171 mg/dL (ref 0–200)
HDL: 46 mg/dL (ref >39)
LDL Cholesterol: 113 mg/dL — ABNORMAL HIGH (ref 0–99)
TRIGLYCERIDES: 62 mg/dL (ref <150)
Total CHOL/HDL Ratio: 3.7 Ratio
VLDL: 12 mg/dL (ref 0–40)

## 2013-04-21 LAB — VITAMIN D 25 HYDROXY (VIT D DEFICIENCY, FRACTURES): Vit D, 25-Hydroxy: 21 ng/mL — ABNORMAL LOW (ref 30–89)

## 2013-04-21 LAB — TSH: TSH: 1.663 u[IU]/mL (ref 0.350–4.500)

## 2013-04-25 ENCOUNTER — Telehealth: Payer: Self-pay | Admitting: *Deleted

## 2013-04-25 NOTE — Telephone Encounter (Signed)
Message copied by Taquila Leys, [place] R on Wed Apr 25, 2013 10:01 AM ------      Message from: Susie CassetteABROL MD, Germain OsgoodNAYANA      Created: Mon Apr 23, 2013  1:24 PM       Vitamin D level slightly low. Patient should start taking vitamin D over-the-counter 2000 international units twice a day ------

## 2013-04-25 NOTE — Telephone Encounter (Signed)
Contacted patient with an interpreter. Notified patient of her lab results. Instructed patient to purchase Vitamin D OTC BID. Patient agreed. Call completed as followed.

## 2013-05-04 ENCOUNTER — Other Ambulatory Visit: Payer: Self-pay | Admitting: Internal Medicine

## 2013-05-04 DIAGNOSIS — Z1231 Encounter for screening mammogram for malignant neoplasm of breast: Secondary | ICD-10-CM

## 2013-05-16 ENCOUNTER — Ambulatory Visit
Admission: RE | Admit: 2013-05-16 | Discharge: 2013-05-16 | Disposition: A | Discharge status: Home / Self Care | Payer: No Typology Code available for payment source | Source: Ambulatory Visit | Attending: Internal Medicine | Admitting: Internal Medicine

## 2013-05-16 DIAGNOSIS — Z1231 Encounter for screening mammogram for malignant neoplasm of breast: Secondary | ICD-10-CM

## 2013-05-17 ENCOUNTER — Other Ambulatory Visit: Payer: Self-pay | Admitting: Internal Medicine

## 2013-05-17 ENCOUNTER — Telehealth: Payer: Self-pay | Admitting: Emergency Medicine

## 2013-05-17 DIAGNOSIS — R928 Other abnormal and inconclusive findings on diagnostic imaging of breast: Secondary | ICD-10-CM

## 2013-05-17 NOTE — Telephone Encounter (Signed)
Message copied by Darlis LoanSMITH, [place] Merida D on Thu May 17, 2013  5:34 PM ------      Message from: Susie CassetteABROL MD, Germain OsgoodNAYANA      Created: Thu May 17, 2013  4:17 PM       Notify patient that the mammogram was abnormal, for the right breast. The patient needs to get an ultrasound of the breast. Orders placed for right breast ultrasound. She needs to contact radiology immediately. ------

## 2013-05-17 NOTE — Telephone Encounter (Signed)
Pt given mammogram results. i will schedule f/u u/s appt tomorrow

## 2013-05-17 NOTE — Addendum Note (Signed)
Addended by: Susie CassetteABROL MD, Germain OsgoodNAYANA on: 05/17/2013 04:16 PM   Modules accepted: Orders

## 2013-06-15 ENCOUNTER — Ambulatory Visit
Admission: RE | Admit: 2013-06-15 | Discharge: 2013-06-15 | Disposition: A | Discharge status: Home / Self Care | Payer: No Typology Code available for payment source | Source: Ambulatory Visit | Attending: Internal Medicine | Admitting: Internal Medicine

## 2013-06-15 DIAGNOSIS — R928 Other abnormal and inconclusive findings on diagnostic imaging of breast: Secondary | ICD-10-CM

## 2013-07-18 ENCOUNTER — Ambulatory Visit: Payer: No Typology Code available for payment source | Attending: Internal Medicine | Admitting: Internal Medicine

## 2013-07-18 ENCOUNTER — Encounter: Payer: Self-pay | Admitting: Internal Medicine

## 2013-07-18 VITALS — BP 133/97 | HR 82 | Temp 97.8°F | Resp 16 | Ht <= 58 in | Wt 147.0 lb

## 2013-07-18 DIAGNOSIS — K044 Acute apical periodontitis of pulpal origin: Secondary | ICD-10-CM | POA: Insufficient documentation

## 2013-07-18 DIAGNOSIS — I10 Essential (primary) hypertension: Secondary | ICD-10-CM | POA: Insufficient documentation

## 2013-07-18 DIAGNOSIS — K047 Periapical abscess without sinus: Secondary | ICD-10-CM

## 2013-07-18 MED ORDER — HYDROCHLOROTHIAZIDE 12.5 MG PO TABS: 12.5000 mg | ORAL_TABLET | Freq: Every day | ORAL | Status: DC

## 2013-07-18 MED ORDER — AMOXICILLIN 500 MG PO CAPS: 500.0000 mg | ORAL_CAPSULE | Freq: Three times a day (TID) | ORAL | Status: DC

## 2013-07-18 NOTE — Progress Notes (Signed)
Patient ID: Anne [name], female   DOB: 1971-01-18, 43 y.o.   MRN: [id number]  CC: dental pain  HPI: Patient presents to clinic today for a dental referral.  Patient reports that she has had increased pain from numerous teeth in her mouth.  Patient reports that she has removed two teeth a few weeks ago because of pain, yanked out with hand.  She reports that she was having progressive problems with eating and speaking. She has noted improvement in pain since removing teeth.   Allergies  Allergen Reactions  . Oxycodone Nausea And Vomiting    dizzyness    Past Medical History  Diagnosis Date  . H/O: hysterectomy   . Hernia, umbilical   . Complication of anesthesia     [place]- also did not wake up for 5 hours,.  . Leg wound, left     history as a child, was hospitalized for  6 months, Left leg now much smaller than right , painful at times   No current outpatient prescriptions on file prior to visit.   No current facility-administered medications on file prior to visit.   Family History  Problem Relation Age of Onset  . Anesthesia problems Neg Hx    History   Social History  . Marital Status: Single    Spouse Name: N/A    Number of Children: N/A  . Years of Education: N/A   Occupational History  . Not on file.   Social History Main Topics  . Smoking status: Never Smoker   . Smokeless tobacco: Never Used  . Alcohol Use: No  . Drug Use: No  . Sexual Activity: Yes    Birth Control/ Protection: Surgical   Other Topics Concern  . Not on file   Social History Narrative  . No narrative on file   Review of Systems  Constitutional: Negative for fever and chills.  HENT:       Tooth ache   Respiratory: Negative.   Cardiovascular: Negative for chest pain.  Gastrointestinal: Positive for abdominal pain.       Had surgery July 2014-cannot remember what surgery, having pain on left side   Neurological: Positive for headaches.      Objective:   Filed Vitals:   07/18/13 1616  BP: 133/97  Pulse: 82  Temp: 97.8 F (36.6 C)  Resp: 16   Physical Exam  Vitals reviewed. Constitutional: She appears well-nourished.  HENT:  Right Ear: External ear normal.  Left Ear: External ear normal.  Mouth/Throat: Oropharynx is clear and moist.  Very poor dentition  Cardiovascular: Normal rate, regular rhythm and normal heart sounds.   Pulmonary/Chest: Effort normal and breath sounds normal.  Abdominal: Soft. Bowel sounds are normal. She exhibits mass (old surgical site, knot-Scar tissue?). There is tenderness (umbilical).  Skin: Skin is warm and dry. She is not diaphoretic.  Psychiatric: She has a normal mood and affect.   .  Lab Results  Component Value Date   WBC 6.6 04/20/2013   HGB 13.4 04/20/2013   HCT 40.2 04/20/2013   MCV 82.2 04/20/2013   PLT 156 04/20/2013   Lab Results  Component Value Date   CREATININE 0.64 04/20/2013   BUN 11 04/20/2013   NA 140 04/20/2013   K 3.8 04/20/2013   CL 105 04/20/2013   CO2 28 04/20/2013    No results found for this basename: HGBA1C   Lipid Panel     Component Value Date/Time   CHOL 171 04/20/2013  1744   TRIG 62 04/20/2013 1744   HDL 46 04/20/2013 1744   CHOLHDL 3.7 04/20/2013 1744   VLDL 12 04/20/2013 1744   LDLCALC 113* 04/20/2013 1744       Assessment and plan:   Abrina was seen today for follow-up.  Diagnoses and associated orders for this visit:  Dental infection - amoxicillin (AMOXIL) 500 MG capsule; Take 1 capsule (500 mg total) by mouth 3 (three) times daily.  HTN (hypertension) - Begin taking  hydrochlorothiazide (HYDRODIURIL) 12.5 MG tablet; Take 1 tablet (12.5 mg total) by mouth daily.  Due to language barrier, an interpreter was present during the history-taking and subsequent discussion (and for part of the physical exam) with this patient.   Return in about 1 week (around 07/25/2013) for Nurse Visit-BP check and 3 months PCP.   October will need  repeat mammography.  Will call office if have not heard to reschedule.  Discussed last mammogram results.   Ambrose Finland, NP-C [facility] [phone] 07/19/2013, 10:47 PM

## 2013-07-18 NOTE — Progress Notes (Signed)
Pt had a MM that showed abnormal findings. Because of these findings she was asked to come back into the office to speak with the doctor and to have further testing.

## 2013-07-25 ENCOUNTER — Ambulatory Visit: Payer: No Typology Code available for payment source | Attending: Internal Medicine | Admitting: *Deleted

## 2013-07-25 VITALS — BP 127/89 | HR 85 | Resp 14

## 2013-07-25 DIAGNOSIS — I10 Essential (primary) hypertension: Secondary | ICD-10-CM

## 2013-07-25 NOTE — Patient Instructions (Signed)
Hypertension Hypertension is another name for high blood pressure. High blood pressure may mean that your heart needs to work harder to pump blood. Blood pressure consists of two numbers, which includes a higher number over a lower number (example: 110/72). HOME CARE   Make lifestyle changes as told by your doctor. This may include weight loss and exercise.  Take your blood pressure medicine every day.  Limit how much salt you use.  Stop smoking if you smoke.  Do not use drugs.  Talk to your doctor if you are using decongestants or birth control pills. These medicines might make blood pressure higher.  Females should not drink more than 1 alcoholic drink per day. Males should not drink more than 2 alcoholic drinks per day.  See your doctor as told. GET HELP RIGHT AWAY IF:   You have a blood pressure reading with a top number of 180 or higher.  You get a very bad headache.  You get blurred or changing vision.  You feel confused.  You feel weak, numb, or faint.  You get chest or belly (abdominal) pain.  You throw up (vomit).  You cannot breathe very well. MAKE SURE YOU:   Understand these instructions.  Will watch your condition.  Will get help right away if you are not doing well or get worse. Document Released: 07/28/2007 Document Revised: 05/03/2011 Document Reviewed: 07/28/2007 [employer] Patient Information 2014 [employer].  

## 2013-07-25 NOTE — Progress Notes (Signed)
Patient here for BP check. BP within normal limits.

## 2013-09-19 ENCOUNTER — Ambulatory Visit: Payer: No Typology Code available for payment source | Attending: Internal Medicine | Admitting: Internal Medicine

## 2013-09-19 ENCOUNTER — Encounter: Payer: Self-pay | Admitting: Internal Medicine

## 2013-09-19 VITALS — BP 127/85 | HR 87 | Temp 98.2°F | Resp 16 | Wt 148.6 lb

## 2013-09-19 DIAGNOSIS — E559 Vitamin D deficiency, unspecified: Secondary | ICD-10-CM | POA: Insufficient documentation

## 2013-09-19 DIAGNOSIS — I10 Essential (primary) hypertension: Secondary | ICD-10-CM | POA: Insufficient documentation

## 2013-09-19 LAB — COMPLETE METABOLIC PANEL WITH GFR
ALBUMIN: 3.9 g/dL (ref 3.5–5.2)
ALT: 23 U/L (ref 0–35)
AST: 22 U/L (ref 0–37)
Alkaline Phosphatase: 55 U/L (ref 39–117)
BUN: 9 mg/dL (ref 6–23)
CALCIUM: 8.9 mg/dL (ref 8.4–10.5)
CHLORIDE: 103 meq/L (ref 96–112)
CO2: 25 meq/L (ref 19–32)
Creat: 0.7 mg/dL (ref 0.50–1.10)
GFR, Est African American: >89 mL/min
GLUCOSE: 82 mg/dL (ref 70–99)
POTASSIUM: 3.7 meq/L (ref 3.5–5.3)
Sodium: 138 mEq/L (ref 135–145)
Total Bilirubin: 0.6 mg/dL (ref 0.2–1.2)
Total Protein: 7 g/dL (ref 6.0–8.3)

## 2013-09-19 MED ORDER — VITAMIN D (ERGOCALCIFEROL) 1.25 MG (50000 UNIT) PO CAPS: 50000.0000 [IU] | ORAL_CAPSULE | ORAL | Status: DC

## 2013-09-19 MED ORDER — HYDROCHLOROTHIAZIDE 12.5 MG PO TABS: 12.5000 mg | ORAL_TABLET | Freq: Every day | ORAL | Status: DC

## 2013-09-19 NOTE — Progress Notes (Signed)
MRN: [id number] Name: Anne [name]  Sex: female Age: 43 y.o. DOB: [birth year]  Allergies: Oxycodone  Chief Complaint  Patient presents with  . Follow-up    HPI: Patient is 43 y.o. female who history of hypertension comes today for followup, denies any headache dizziness chest and shortness of breath, she is requesting refill on her medication, previous blood work reviewed noticed vitamin D deficiency as per patient she did not get the prescription.  Past Medical History  Diagnosis Date  . H/O: hysterectomy   . Hernia, umbilical   . Complication of anesthesia     Headache after anesthesia in [place]- also did not wake up for 5 hours,.  . Leg wound, left     history as a child, was hospitalized for  6 months, Left leg now much smaller than right , painful at times    Past Surgical History  Procedure Laterality Date  . Abdominal hysterectomy    . Hernia repair    . Mass excision N/A 08/21/2012    Procedure: Excision of umbilical cyst, removal of umbilicus;  Surgeon: Shelly Rubenstein, MD;  Location: [facility] OR;  Service: General;  Laterality: N/A;  . Cyst removal trunk        Medication List       This list is accurate as of: 09/19/13 11:20 AM.  Always use your most recent med list.               amoxicillin 500 MG capsule  Commonly known as:  AMOXIL  Take 1 capsule (500 mg total) by mouth 3 (three) times daily.     hydrochlorothiazide 12.5 MG tablet  Commonly known as:  HYDRODIURIL  Take 1 tablet (12.5 mg total) by mouth daily.     Vitamin D (Ergocalciferol) 50000 UNITS Caps capsule  Commonly known as:  DRISDOL  Take 1 capsule (50,000 Units total) by mouth every 7 (seven) days.        Meds ordered this encounter  Medications  . hydrochlorothiazide (HYDRODIURIL) 12.5 MG tablet    Sig: Take 1 tablet (12.5 mg total) by mouth daily.    Dispense:  30 tablet    Refill:  3  . Vitamin D, Ergocalciferol, (DRISDOL) 50000 UNITS CAPS capsule    Sig: Take 1 capsule  (50,000 Units total) by mouth every 7 (seven) days.    Dispense:  12 capsule    Refill:  0     There is no immunization history on file for this patient.  Family History  Problem Relation Age of Onset  . Anesthesia problems Neg Hx     History  Substance Use Topics  . Smoking status: Never Smoker   . Smokeless tobacco: Never Used  . Alcohol Use: No    Review of Systems   As noted in HPI  Filed Vitals:   09/19/13 1056  BP: 127/85  Pulse: 87  Temp: 98.2 F (36.8 C)  Resp: 16    Physical Exam  Physical Exam  Constitutional: No distress.  Eyes: EOM are normal. Pupils are equal, round, and reactive to light.  Neck: Neck supple.  Cardiovascular: Normal rate and regular rhythm.   Pulmonary/Chest: Breath sounds normal. No respiratory distress. She has no wheezes. She has no rales.  Musculoskeletal: She exhibits no edema.    CBC    Component Value Date/Time   WBC 6.6 04/20/2013 1744   RBC 4.89 04/20/2013 1744   HGB 13.4 04/20/2013 1744   HCT 40.2 04/20/2013 1744  PLT 156 04/20/2013 1744   MCV 82.2 04/20/2013 1744   LYMPHSABS 2.8 04/20/2013 1744   MONOABS 0.6 04/20/2013 1744   EOSABS 0.3 04/20/2013 1744   BASOSABS 0.0 04/20/2013 1744    CMP     Component Value Date/Time   NA 140 04/20/2013 1744   K 3.8 04/20/2013 1744   CL 105 04/20/2013 1744   CO2 28 04/20/2013 1744   GLUCOSE 80 04/20/2013 1744   BUN 11 04/20/2013 1744   CREATININE 0.64 04/20/2013 1744   CREATININE 0.73 11/29/2011 1847   CALCIUM 9.1 04/20/2013 1744   PROT 7.4 04/20/2013 1744   ALBUMIN 4.3 04/20/2013 1744   AST 18 04/20/2013 1744   ALT 16 04/20/2013 1744   ALKPHOS 56 04/20/2013 1744   BILITOT 0.4 04/20/2013 1744   GFRNONAA >89 04/20/2013 1744   GFRNONAA >90 11/29/2011 1847   GFRAA >89 04/20/2013 1744   GFRAA >90 11/29/2011 1847    Lab Results  Component Value Date/Time   CHOL 171 04/20/2013  5:44 PM    No components found with this basename: hga1c    Lab Results  Component Value Date/Time   AST  18 04/20/2013  5:44 PM    Assessment and Plan  Essential hypertension - Plan: Advised for DASH diet continue with her hydrochlorothiazide (HYDRODIURIL) 12.5 MG tablet, will repeat her COMPLETE METABOLIC PANEL WITH GFR  Unspecified vitamin D deficiency - Plan: Started patient on Vitamin D, Ergocalciferol, (DRISDOL) 50000 UNITS CAPS capsule   Return in about 3 months (around 12/20/2013) for hypertension.  Doris CheadleADVANI, Lyly Canizales, MD

## 2013-09-19 NOTE — Progress Notes (Signed)
Patient here with interpreter Patient states here for routine follow up

## 2013-09-19 NOTE — Patient Instructions (Signed)
DASH Eating Plan °DASH stands for "Dietary Approaches to Stop Hypertension." The DASH eating plan is a healthy eating plan that has been shown to reduce high blood pressure (hypertension). Additional health benefits may include reducing the risk of type 2 diabetes mellitus, heart disease, and stroke. The DASH eating plan may also help with weight loss. °WHAT DO I NEED TO KNOW ABOUT THE DASH EATING PLAN? °For the DASH eating plan, you will follow these general guidelines: °· Choose foods with a percent daily value for sodium of less than 5% (as listed on the food label). °· Use salt-free seasonings or herbs instead of table salt or sea salt. °· Check with your health care provider or pharmacist before using salt substitutes. °· Eat lower-sodium products, often labeled as "lower sodium" or "no salt added." °· Eat fresh foods. °· Eat more vegetables, fruits, and low-fat dairy products. °· Choose whole grains. Look for the word "whole" as the first word in the ingredient list. °· Choose fish and skinless chicken or turkey more often than red meat. Limit fish, poultry, and meat to 6 oz (170 g) each day. °· Limit sweets, desserts, sugars, and sugary drinks. °· Choose heart-healthy fats. °· Limit cheese to 1 oz (28 g) per day. °· Eat more home-cooked food and less restaurant, buffet, and fast food. °· Limit fried foods. °· Cook foods using methods other than frying. °· Limit canned vegetables. If you do use them, rinse them well to decrease the sodium. °· When eating at a restaurant, ask that your food be prepared with less salt, or no salt if possible. °WHAT FOODS CAN I EAT? °Seek help from a dietitian for individual calorie needs. °Grains °Whole grain or whole wheat bread. Brown rice. Whole grain or whole wheat pasta. Quinoa, bulgur, and whole grain cereals. Low-sodium cereals. Corn or whole wheat flour tortillas. Whole grain cornbread. Whole grain crackers. Low-sodium crackers. °Vegetables °Fresh or frozen vegetables  (raw, steamed, roasted, or grilled). Low-sodium or reduced-sodium tomato and vegetable juices. Low-sodium or reduced-sodium tomato sauce and paste. Low-sodium or reduced-sodium canned vegetables.  °Fruits °All fresh, canned (in natural juice), or frozen fruits. °Meat and Other Protein Products °Ground beef (85% or leaner), grass-fed beef, or beef trimmed of fat. Skinless chicken or turkey. Ground chicken or turkey. Pork trimmed of fat. All fish and seafood. Eggs. Dried beans, peas, or lentils. Unsalted nuts and seeds. Unsalted canned beans. °Dairy °Low-fat dairy products, such as skim or 1% milk, 2% or reduced-fat cheeses, low-fat ricotta or cottage cheese, or plain low-fat yogurt. Low-sodium or reduced-sodium cheeses. °Fats and Oils °Tub margarines without trans fats. Light or reduced-fat mayonnaise and salad dressings (reduced sodium). Avocado. Safflower, olive, or canola oils. Natural peanut or almond butter. °Other °Unsalted popcorn and pretzels. °The items listed above may not be a complete list of recommended foods or beverages. Contact your dietitian for more options. °WHAT FOODS ARE NOT RECOMMENDED? °Grains °White bread. White pasta. White rice. Refined cornbread. Bagels and croissants. Crackers that contain trans fat. °Vegetables °Creamed or fried vegetables. Vegetables in a cheese sauce. Regular canned vegetables. Regular canned tomato sauce and paste. Regular tomato and vegetable juices. °Fruits °Dried fruits. Canned fruit in light or heavy syrup. Fruit juice. °Meat and Other Protein Products °Fatty cuts of meat. Ribs, chicken wings, bacon, sausage, bologna, salami, chitterlings, fatback, hot dogs, bratwurst, and packaged luncheon meats. Salted nuts and seeds. Canned beans with salt. °Dairy °Whole or 2% milk, cream, half-and-half, and cream cheese. Whole-fat or sweetened yogurt. Full-fat   cheeses or blue cheese. Nondairy creamers and whipped toppings. Processed cheese, cheese spreads, or cheese  curds. °Condiments °Onion and garlic salt, seasoned salt, table salt, and sea salt. Canned and packaged gravies. Worcestershire sauce. Tartar sauce. Barbecue sauce. Teriyaki sauce. Soy sauce, including reduced sodium. Steak sauce. Fish sauce. Oyster sauce. Cocktail sauce. Horseradish. Ketchup and mustard. Meat flavorings and tenderizers. Bouillon cubes. Hot sauce. Tabasco sauce. Marinades. Taco seasonings. Relishes. °Fats and Oils °Butter, stick margarine, lard, shortening, ghee, and bacon fat. Coconut, palm kernel, or palm oils. Regular salad dressings. °Other °Pickles and olives. Salted popcorn and pretzels. °The items listed above may not be a complete list of foods and beverages to avoid. Contact your dietitian for more information. °WHERE CAN I FIND MORE INFORMATION? °National Heart, Lung, and [employer] Institute: www.nhlbi.nih.gov/health/health-topics/topics/dash/ °Document Released: 01/28/2011 Document Revised: 06/25/2013 Document Reviewed: 12/13/2012 °ExitCare® Patient Information ©2015 [employer]. This information is not intended to replace advice given to you by your health care provider. Make sure you discuss any questions you have with your health care provider. ° °

## 2014-01-02 ENCOUNTER — Encounter: Payer: Self-pay | Admitting: Internal Medicine

## 2014-01-02 ENCOUNTER — Ambulatory Visit (HOSPITAL_BASED_OUTPATIENT_CLINIC_OR_DEPARTMENT_OTHER): Payer: No Typology Code available for payment source

## 2014-01-02 ENCOUNTER — Ambulatory Visit: Payer: No Typology Code available for payment source | Attending: Internal Medicine | Admitting: Internal Medicine

## 2014-01-02 VITALS — BP 141/94 | HR 90 | Temp 98.4°F | Resp 16 | Wt 147.8 lb

## 2014-01-02 DIAGNOSIS — Z79899 Other long term (current) drug therapy: Secondary | ICD-10-CM | POA: Insufficient documentation

## 2014-01-02 DIAGNOSIS — N809 Endometriosis, unspecified: Secondary | ICD-10-CM | POA: Insufficient documentation

## 2014-01-02 DIAGNOSIS — Z23 Encounter for immunization: Secondary | ICD-10-CM

## 2014-01-02 DIAGNOSIS — R1900 Intra-abdominal and pelvic swelling, mass and lump, unspecified site: Secondary | ICD-10-CM | POA: Insufficient documentation

## 2014-01-02 DIAGNOSIS — M7120 Synovial cyst of popliteal space [Baker], unspecified knee: Secondary | ICD-10-CM | POA: Insufficient documentation

## 2014-01-02 DIAGNOSIS — I10 Essential (primary) hypertension: Secondary | ICD-10-CM | POA: Insufficient documentation

## 2014-01-02 MED ORDER — HYDROCHLOROTHIAZIDE 12.5 MG PO TABS: 12.5000 mg | ORAL_TABLET | Freq: Every day | ORAL | Status: DC

## 2014-01-02 NOTE — Progress Notes (Signed)
MRN: [id number] Name: Anne [name]  Sex: female Age: 43 y.o. DOB: Nov 19, 1970  Allergies: Oxycodone  Chief Complaint  Patient presents with  . Follow-up    HPI: Patient is 43 y.o. female who has  history of hypertension , as per patient she ran out of her blood pressure medication today her blood pressure is borderline elevated, denies any headache dizziness chest pain or shortness of breath, she has history of Baker's cyst and had surgery done last year the pathology reported endometriosis no malignancy, patient does complain of still some lump/mass and has some tenderness in the periumbilical area.  Past Medical History  Diagnosis Date  . H/O: hysterectomy   . Hernia, umbilical   . Complication of anesthesia     [place]- also did not wake up for 5 hours,.  . Leg wound, left     history as a child, was hospitalized for  6 months, Left leg now much smaller than right , painful at times    Past Surgical History  Procedure Laterality Date  . Abdominal hysterectomy    . Hernia repair    . Mass excision N/A 08/21/2012    Procedure: Excision of umbilical cyst, removal of umbilicus;  Surgeon: Shelly Rubensteinouglas A Blackman, MD;  [facility];  Service: General;  Laterality: N/A;  . Cyst removal trunk        Medication List       This list is accurate as of: 01/02/14  5:16 PM.  Always use your most recent med list.               amoxicillin 500 MG capsule  Commonly known as:  AMOXIL  Take 1 capsule (500 mg total) by mouth 3 (three) times daily.     hydrochlorothiazide 12.5 MG tablet  Commonly known as:  HYDRODIURIL  Take 1 tablet (12.5 mg total) by mouth daily.     Vitamin D (Ergocalciferol) 50000 UNITS Caps capsule  Commonly known as:  DRISDOL  Take 1 capsule (50,000 Units total) by mouth every 7 (seven) days.        Meds ordered this encounter  Medications  . hydrochlorothiazide (HYDRODIURIL) 12.5 MG tablet    Sig: Take 1 tablet (12.5  mg total) by mouth daily.    Dispense:  30 tablet    Refill:  3     There is no immunization history on file for this patient.  Family History  Problem Relation Age of Onset  . Anesthesia problems Neg Hx     History  Substance Use Topics  . Smoking status: Never Smoker   . Smokeless tobacco: Never Used  . Alcohol Use: No    Review of Systems   As noted in HPI  Filed Vitals:   01/02/14 1657  BP: 141/94  Pulse: 90  Temp: 98.4 F (36.9 C)  Resp: 16    Physical Exam  Physical Exam  Cardiovascular: Normal rate and regular rhythm.   Pulmonary/Chest: Breath sounds normal. No respiratory distress. She has no wheezes. She has no rales.  Abdominal:  Periumbilical lump minimal tenderness     CBC    Component Value Date/Time   WBC 6.6 04/20/2013 1744   RBC 4.89 04/20/2013 1744   HGB 13.4 04/20/2013 1744   HCT 40.2 04/20/2013 1744   PLT 156 04/20/2013 1744   MCV 82.2 04/20/2013 1744   LYMPHSABS 2.8 04/20/2013 1744   MONOABS 0.6 04/20/2013 1744   EOSABS 0.3 04/20/2013 1744  BASOSABS 0.0 04/20/2013 1744    CMP     Component Value Date/Time   NA 138 09/19/2013 1121   K 3.7 09/19/2013 1121   CL 103 09/19/2013 1121   CO2 25 09/19/2013 1121   GLUCOSE 82 09/19/2013 1121   BUN 9 09/19/2013 1121   CREATININE 0.70 09/19/2013 1121   CREATININE 0.73 11/29/2011 1847   CALCIUM 8.9 09/19/2013 1121   PROT 7.0 09/19/2013 1121   ALBUMIN 3.9 09/19/2013 1121   AST 22 09/19/2013 1121   ALT 23 09/19/2013 1121   ALKPHOS 55 09/19/2013 1121   BILITOT 0.6 09/19/2013 1121   GFRNONAA >89 09/19/2013 1121   GFRNONAA >90 11/29/2011 1847   GFRAA >89 09/19/2013 1121   GFRAA >90 11/29/2011 1847    Lab Results  Component Value Date/Time   CHOL 171 04/20/2013 05:44 PM    No components found for: HGA1C  Lab Results  Component Value Date/Time   AST 22 09/19/2013 11:21 AM    Assessment and Plan  Essential hypertension - Plan:resume back on hydrochlorothiazide  (HYDRODIURIL) 12.5 MG tablet, will checkCOMPLETE METABOLIC PANEL WITH GFR  Abdominal swelling, mass, or lump - Plan:have ordered an ultrasound for further evaluation US Abdomen Limited   Health Maintenance Flu shot today   Return in about 3 months (around 04/04/2014) for hypertension.  Doris CheadleADVANI, Claudia Alvizo, MD

## 2014-01-02 NOTE — Progress Notes (Signed)
Patient here with interpreter Here for follow up on her blood pressure

## 2014-01-03 ENCOUNTER — Telehealth: Payer: Self-pay

## 2014-01-03 LAB — COMPLETE METABOLIC PANEL WITH GFR
ALK PHOS: 60 U/L (ref 39–117)
ALT: 16 U/L (ref 0–35)
AST: 18 U/L (ref 0–37)
Albumin: 4.3 g/dL (ref 3.5–5.2)
BILIRUBIN TOTAL: 0.5 mg/dL (ref 0.2–1.2)
BUN: 13 mg/dL (ref 6–23)
CO2: 26 mEq/L (ref 19–32)
Calcium: 9.4 mg/dL (ref 8.4–10.5)
Chloride: 103 mEq/L (ref 96–112)
Creat: 0.8 mg/dL (ref 0.50–1.10)
GFR, Est African American: >89 mL/min
GLUCOSE: 86 mg/dL (ref 70–99)
Potassium: 3.7 mEq/L (ref 3.5–5.3)
SODIUM: 140 meq/L (ref 135–145)
TOTAL PROTEIN: 7.5 g/dL (ref 6.0–8.3)

## 2014-01-03 NOTE — Telephone Encounter (Signed)
Interpreter line used Patient not available Unable to leave message Message states at the subscribers request does not accept incoming calls

## 2014-01-08 ENCOUNTER — Ambulatory Visit (HOSPITAL_COMMUNITY)
Admission: RE | Admit: 2014-01-08 | Discharge: 2014-01-08 | Disposition: A | Discharge status: Home / Self Care | Payer: No Typology Code available for payment source | Source: Ambulatory Visit | Attending: Internal Medicine | Admitting: Internal Medicine

## 2014-01-08 DIAGNOSIS — R1905 Periumbilic swelling, mass or lump: Secondary | ICD-10-CM | POA: Insufficient documentation

## 2014-01-08 DIAGNOSIS — R1900 Intra-abdominal and pelvic swelling, mass and lump, unspecified site: Secondary | ICD-10-CM

## 2014-01-10 ENCOUNTER — Other Ambulatory Visit (INDEPENDENT_AMBULATORY_CARE_PROVIDER_SITE_OTHER): Payer: Self-pay | Admitting: Surgery

## 2014-01-20 ENCOUNTER — Encounter (HOSPITAL_COMMUNITY): Payer: Self-pay | Admitting: *Deleted

## 2014-01-20 ENCOUNTER — Emergency Department (HOSPITAL_COMMUNITY)
Admission: EM | Admit: 2014-01-20 | Discharge: 2014-01-20 | Disposition: A | Discharge status: Home / Self Care | Payer: No Typology Code available for payment source | Source: Home / Self Care

## 2014-01-20 ENCOUNTER — Emergency Department (HOSPITAL_COMMUNITY)
Admission: EM | Admit: 2014-01-20 | Discharge: 2014-01-20 | Disposition: A | Discharge status: Home / Self Care | Payer: No Typology Code available for payment source | Attending: Emergency Medicine | Admitting: Emergency Medicine

## 2014-01-20 ENCOUNTER — Encounter (HOSPITAL_COMMUNITY): Payer: Self-pay | Admitting: Emergency Medicine

## 2014-01-20 DIAGNOSIS — L02216 Cutaneous abscess of umbilicus: Secondary | ICD-10-CM | POA: Insufficient documentation

## 2014-01-20 DIAGNOSIS — Z8719 Personal history of other diseases of the digestive system: Secondary | ICD-10-CM | POA: Insufficient documentation

## 2014-01-20 DIAGNOSIS — Z79899 Other long term (current) drug therapy: Secondary | ICD-10-CM | POA: Insufficient documentation

## 2014-01-20 DIAGNOSIS — R198 Other specified symptoms and signs involving the digestive system and abdomen: Secondary | ICD-10-CM

## 2014-01-20 DIAGNOSIS — Z90710 Acquired absence of both cervix and uterus: Secondary | ICD-10-CM | POA: Insufficient documentation

## 2014-01-20 LAB — BASIC METABOLIC PANEL
Anion gap: 14 (ref 5–15)
BUN: 8 mg/dL (ref 6–23)
CO2: 24 meq/L (ref 19–32)
Calcium: 9.5 mg/dL (ref 8.4–10.5)
Chloride: 99 mEq/L (ref 96–112)
Creatinine, Ser: 0.67 mg/dL (ref 0.50–1.10)
GFR calc Af Amer: >90 mL/min (ref >90)
GLUCOSE: 93 mg/dL (ref 70–99)
Potassium: 3.6 mEq/L — ABNORMAL LOW (ref 3.7–5.3)
SODIUM: 137 meq/L (ref 137–147)

## 2014-01-20 LAB — CBC WITH DIFFERENTIAL/PLATELET
Basophils Absolute: 0 10*3/uL (ref 0.0–0.1)
Basophils Relative: 0 % (ref 0–1)
EOS ABS: 0.2 10*3/uL (ref 0.0–0.7)
EOS PCT: 3 % (ref 0–5)
HEMATOCRIT: 36.6 % (ref 36.0–46.0)
Hemoglobin: 12.7 g/dL (ref 12.0–15.0)
LYMPHS ABS: 2.9 10*3/uL (ref 0.7–4.0)
Lymphocytes Relative: 42 % (ref 12–46)
MCH: 27.4 pg (ref 26.0–34.0)
MCHC: 34.7 g/dL (ref 30.0–36.0)
MCV: 78.9 fL (ref 78.0–100.0)
MONO ABS: 0.5 10*3/uL (ref 0.1–1.0)
MONOS PCT: 7 % (ref 3–12)
Neutro Abs: 3.2 10*3/uL (ref 1.7–7.7)
Neutrophils Relative %: 48 % (ref 43–77)
PLATELETS: 153 10*3/uL (ref 150–400)
RBC: 4.64 MIL/uL (ref 3.87–5.11)
RDW: 13.9 % (ref 11.5–15.5)
WBC: 6.8 10*3/uL (ref 4.0–10.5)

## 2014-01-20 LAB — POCT URINALYSIS DIP (DEVICE)
Bilirubin Urine: NEGATIVE
Glucose, UA: NEGATIVE mg/dL
KETONES UR: NEGATIVE mg/dL
Leukocytes, UA: NEGATIVE
Nitrite: NEGATIVE
PROTEIN: NEGATIVE mg/dL
SPECIFIC GRAVITY, URINE: 1.015 (ref 1.005–1.030)
Urobilinogen, UA: 0.2 mg/dL (ref 0.0–1.0)
pH: 6 (ref 5.0–8.0)

## 2014-01-20 MED ORDER — CEPHALEXIN 500 MG PO CAPS: 500.0000 mg | ORAL_CAPSULE | Freq: Four times a day (QID) | ORAL | Status: DC

## 2014-01-20 MED ORDER — IOHEXOL 300 MG/ML  SOLN: 25.0000 mL | INTRAMUSCULAR | Status: DC | PRN

## 2014-01-20 MED ORDER — HYDROCODONE-ACETAMINOPHEN 5-325 MG PO TABS: 1.0000 | ORAL_TABLET | Freq: Four times a day (QID) | ORAL | Status: DC | PRN

## 2014-01-20 MED ORDER — LIDOCAINE-EPINEPHRINE (PF) 2 %-1:200000 IJ SOLN
10.0000 mL | Freq: Once | INTRAMUSCULAR | Status: AC
  Administered 2014-01-20: 10 mL
  Filled 2014-01-20: qty 20

## 2014-01-20 MED ORDER — LIDOCAINE-EPINEPHRINE 2 %-1:100000 IJ SOLN
20.0000 mL | Freq: Once | INTRAMUSCULAR | Status: DC
  Filled 2014-01-20: qty 20

## 2014-01-20 NOTE — ED Notes (Signed)
Pt states that her stomach has been hurting for a week. Pt states she noticed a discharge starting this morning from her umbilicus.

## 2014-01-20 NOTE — ED Provider Notes (Signed)
CSN: [id number]     Arrival date & time 01/20/14  1446 History   First MD Initiated Contact with Patient 01/20/14 1611     Chief Complaint  Patient presents with  . Abdominal Pain  . Wound Check     (Consider location/radiation/quality/duration/timing/severity/associated sxs/prior Treatment) HPI Comments: Patient presents to the ED with a chief complaint of periumbilical pain and bleeding.  [facility], and was referred to the ED for further workup.  Patient states that she had an umbilical cyst removed by Dr. Magnus IvanBlackman approximately one year ago.  She states that about a week ago she began developing periumbilical pain.  She states that the pain has progressively worsened.  She rates the pain at 9/10.  It is worsened with palpation.  She states that today she noticed that there was blood oozing from the umbilicus.  She has not tried taking anything to alleviate her symptoms.  She denies any fevers, chills, nausea, or vomiting.  Patient states that the pain is so great that it has kept her from sleeping for the past two nights.  The history is provided by the patient. No language interpreter was used.    Past Medical History  Diagnosis Date  . H/O: hysterectomy   . Hernia, umbilical   . Complication of anesthesia     [place]- also did not wake up for 5 hours,.  . Leg wound, left     history as a child, was hospitalized for  6 months, Left leg now much smaller than right , painful at times   Past Surgical History  Procedure Laterality Date  . Abdominal hysterectomy    . Hernia repair    . Mass excision N/A 08/21/2012    Procedure: Excision of umbilical cyst, removal of umbilicus;  Surgeon: Shelly Rubensteinouglas A Blackman, MD;  [facility];  Service: General;  Laterality: N/A;  . Cyst removal trunk     Family History  Problem Relation Age of Onset  . Anesthesia problems Neg Hx    History  Substance Use Topics  . Smoking status: Never Smoker    . Smokeless tobacco: Never Used  . Alcohol Use: No   OB History    Gravida Para Term Preterm AB TAB SAB Ectopic Multiple Living   0 0 0 0 0 0 0 0 0 0      Review of Systems  Constitutional: Negative for fever and chills.  Respiratory: Negative for shortness of breath.   Cardiovascular: Negative for chest pain.  Gastrointestinal: Negative for nausea, vomiting, diarrhea and constipation.  Genitourinary: Negative for dysuria.  Skin: Positive for wound.  All other systems reviewed and are negative.     Allergies  Oxycodone  Home Medications   Prior to Admission medications   Medication Sig Start Date End Date Taking? Authorizing Provider  acetaminophen (TYLENOL) 325 MG tablet Take 650 mg by mouth every 6 (six) hours as needed (pain).   Yes Historical Provider, MD  hydrochlorothiazide (HYDRODIURIL) 12.5 MG tablet Take 1 tablet (12.5 mg total) by mouth daily. 01/02/14  Yes Doris Cheadleeepak Advani, MD  amoxicillin (AMOXIL) 500 MG capsule Take 1 capsule (500 mg total) by mouth 3 (three) times daily. Patient not taking: Reported on 01/20/2014 07/18/13   Ambrose FinlandValerie A Keck, NP  Vitamin D, Ergocalciferol, (DRISDOL) 50000 UNITS CAPS capsule Take 1 capsule (50,000 Units total) by mouth every 7 (seven) days. Patient not taking: Reported on 01/20/2014 09/19/13   Doris Cheadleeepak Advani, MD  BP 127/92 mmHg  Pulse 82  Temp(Src) 97.4 F (36.3 C) (Oral)  Resp 12  SpO2 99% Physical Exam  Constitutional: She is oriented to person, place, and time. She appears well-developed and well-nourished.  HENT:  Head: Normocephalic and atraumatic.  Eyes: Conjunctivae and EOM are normal. Pupils are equal, round, and reactive to light.  Neck: Normal range of motion. Neck supple.  Cardiovascular: Normal rate and regular rhythm.  Exam reveals no gallop and no friction rub.   No murmur heard. Pulmonary/Chest: Effort normal and breath sounds normal. No respiratory distress. She has no wheezes. She has no rales. She exhibits no  tenderness.  Abdominal: Soft. Bowel sounds are normal. She exhibits no distension and no mass. There is tenderness. There is no rebound and no guarding.  Moderate to severe tenderness in the periumbilical region  Musculoskeletal: Normal range of motion. She exhibits no edema or tenderness.  Neurological: She is alert and oriented to person, place, and time.  Skin: Skin is warm and dry.  Mild area of induration around the umbilicus, no purulent discharge, small amount of blood oozing from the umbilicus  Psychiatric: She has a normal mood and affect. Her behavior is normal. Judgment and thought content normal.  Nursing note and vitals reviewed.   ED Course  Procedures (including critical care time) Results for orders placed or performed during the hospital encounter of 01/20/14  CBC with Differential  Result Value Ref Range   WBC 6.8 4.0 - 10.5 K/uL   RBC 4.64 3.87 - 5.11 MIL/uL   Hemoglobin 12.7 12.0 - 15.0 g/dL   HCT 25.936.6 56.336.0 - 87.546.0 %   MCV 78.9 78.0 - 100.0 fL   MCH 27.4 26.0 - 34.0 pg   MCHC 34.7 30.0 - 36.0 g/dL   RDW 64.313.9 32.911.5 - 51.815.5 %   Platelets 153 150 - 400 K/uL   Neutrophils Relative % 48 43 - 77 %   Neutro Abs 3.2 1.7 - 7.7 K/uL   Lymphocytes Relative 42 12 - 46 %   Lymphs Abs 2.9 0.7 - 4.0 K/uL   Monocytes Relative 7 3 - 12 %   Monocytes Absolute 0.5 0.1 - 1.0 K/uL   Eosinophils Relative 3 0 - 5 %   Eosinophils Absolute 0.2 0.0 - 0.7 K/uL   Basophils Relative 0 0 - 1 %   Basophils Absolute 0.0 0.0 - 0.1 K/uL  Basic metabolic panel  Result Value Ref Range   Sodium 137 137 - 147 mEq/L   Potassium 3.6 (L) 3.7 - 5.3 mEq/L   Chloride 99 96 - 112 mEq/L   CO2 24 19 - 32 mEq/L   Glucose, Bld 93 70 - 99 mg/dL   BUN 8 6 - 23 mg/dL   Creatinine, Ser 8.410.67 0.50 - 1.10 mg/dL   Calcium 9.5 8.4 - 66.010.5 mg/dL   GFR calc non Af Amer >90 >90 mL/min   GFR calc Af Amer >90 >90 mL/min   Anion gap 14 5 - 15   [place] Abdomen Limited  01/08/2014   CLINICAL DATA:  Periumbilical pain.  History of umbilical hernia containing endometrioma.  EXAM: LIMITED ABDOMINAL ULTRASOUND  COMPARISON:  CT 11/29/2011  FINDINGS: Within the periumbilical region there is a ovoid, complex mass measuring 1.9 x 1.3 cm. The mass appears to have a solid and complex cystic component. No significant blood flow identified within the mass.  IMPRESSION: Complicated cystic and solid mass in the region of the emboli kiss is identified an corresponds to  the area of concern. Findings are concerning for recurrence of endometriosis within this area.  Results of the exam were discussed with the patient's surgeon Dr. Carman Ching   Electronically Signed   By: Signa Kell M.D.   On: 01/08/2014 15:59   EMERGENCY DEPARTMENT US SOFT TISSUE INTERPRETATION "Study: Limited Ultrasound of the noted body part in comments below"  INDICATIONS: Pain Multiple views of the body part are obtained with a multi-frequency linear probe  PERFORMED BY:  Dr. Juleen China  IMAGES ARCHIVED?: Yes  SIDE:Midline  BODY PART:Abdominal wall  FINDINGS: Abcess present  LIMITATIONS:   INTERPRETATION:  Abcess present  COMMENT:  Fluid collection seen on [place].  INCISION AND DRAINAGE Performed by: Roxy Horseman Consent: Verbal consent obtained. Risks and benefits: risks, benefits and alternatives were discussed Type: abscess  Body area: Abdomen  Anesthesia: local infiltration  Incision was made with a scalpel.  Local anesthetic: lidocaine 2% with epinephrine  Anesthetic total: 5 ml  Complexity: complex Blunt dissection to break up loculations  Drainage: purulent  Drainage amount: minimal  Packing material: not packed  Patient tolerance: Patient tolerated the procedure well with no immediate complications.      EKG Interpretation None      MDM   Final diagnoses:  Abscess, umbilical  Abscess, umbilical    Patient with concern for abscess at the umbilicus.  Patient had surgery to remove a cyst about a year ago.     Patient seen by and discussed with Dr. Juleen China, superficial [place] used to identify superficial abscess at the umbilicus.  Dr. Juleen China recommends I&D and packing.  No signs of peritonitis.  Labs and vitals are reassuring.  Incision and drainage attempted by myself and by Dr. Juleen China.  We were unable to express any purulent drainage.  The I and D was with bedside US guidance, however when the observed fluid collection was breached, there was no discharge.  In discussing the patient further with Dr. Juleen China, we will discharge to home with abx and pain medications.  Recommend surgery follow-up.    Roxy Horseman, PA-C 01/20/14 1824  Raeford Razor, MD 01/23/14 2132

## 2014-01-20 NOTE — ED Notes (Signed)
Pt will be transferred to [facility] [place]. Spoke with 1st nurse gave chief complaint and reason why pt needed to be transferred. Pt will be transferred via shuttle.

## 2014-01-20 NOTE — ED Notes (Signed)
[facility]. Pt had surgery in 2014 to remove umbilicus cyst, pt began having drainage from site today. No acute distress noted at triage.

## 2014-01-20 NOTE — Discharge Instructions (Signed)

## 2014-01-20 NOTE — ED Provider Notes (Signed)
CSN: [id number]     Arrival date & time 01/20/14  1233 History   None    Chief Complaint  Patient presents with  . Abdominal Pain   (Consider location/radiation/quality/duration/timing/severity/associated sxs/prior Treatment)  HPI   Patient is a 43 year old female patient of Dr. Magnus IvanBlackman.  She states she had periumbilical pain and surgery approximately one year ago.  States approximately one week ago started experiencing discharge and periumbilical discomfort.  Started bleeding today while she was at church. She appears acutely uncomfortable.  Past Medical History  Diagnosis Date  . H/O: hysterectomy   . Hernia, umbilical   . Complication of anesthesia     [place] in [place]- also did not wake up for 5 hours,.  . Leg wound, left     history as a child, was hospitalized for  6 months, Left leg now much smaller than right , painful at times   Past Surgical History  Procedure Laterality Date  . Abdominal hysterectomy    . Hernia repair    . Mass excision N/A 08/21/2012    Procedure: Excision of umbilical cyst, removal of umbilicus;  Surgeon: Shelly Rubensteinouglas A Blackman, MD;  [facility];  Service: General;  Laterality: N/A;  . Cyst removal trunk     Family History  Problem Relation Age of Onset  . Anesthesia problems Neg Hx    History  Substance Use Topics  . Smoking status: Never Smoker   . Smokeless tobacco: Never Used  . Alcohol Use: No   OB History    Gravida Para Term Preterm AB TAB SAB Ectopic Multiple Living   0 0 0 0 0 0 0 0 0 0      Review of Systems  Allergies  Oxycodone  Home Medications   Prior to Admission medications   Medication Sig Start Date End Date Taking? Authorizing Provider  hydrochlorothiazide (HYDRODIURIL) 12.5 MG tablet Take 1 tablet (12.5 mg total) by mouth daily. 01/02/14  Yes Doris Cheadleeepak Advani, MD  amoxicillin (AMOXIL) 500 MG capsule Take 1 capsule (500 mg total) by mouth 3 (three) times daily. 07/18/13   Ambrose FinlandValerie A Keck, NP    Vitamin D, Ergocalciferol, (DRISDOL) 50000 UNITS CAPS capsule Take 1 capsule (50,000 Units total) by mouth every 7 (seven) days. 09/19/13   Deepak Advani, MD   BP 136/92 mmHg  Pulse 82  Temp(Src) 98 F (36.7 C) (Oral)  Resp 18  SpO2 99%   Physical Exam  Constitutional: She is oriented to person, place, and time. She appears well-developed and well-nourished. She appears distressed.  Cardiovascular: Normal rate, regular rhythm, normal heart sounds and intact distal pulses.  Exam reveals no gallop and no friction rub.   No murmur heard. Pulmonary/Chest: Effort normal and breath sounds normal. No respiratory distress. She has no wheezes. She has no rales. She exhibits no tenderness.  Abdominal: Soft. Bowel sounds are normal. She exhibits no distension and no mass. There is tenderness in the periumbilical area. There is no rebound and no guarding.    Neurological: She is alert and oriented to person, place, and time.  Skin: Skin is warm. She is diaphoretic.  Nursing note and vitals reviewed.    ED Course  Procedures (including critical care time) Labs Review Labs Reviewed  URINALYSIS, DIPSTICK ONLY   Results for orders placed or performed in visit on 01/02/14  COMPLETE METABOLIC PANEL WITH GFR  Result Value Ref Range   Sodium 140 135 - 145 mEq/L   Potassium 3.7 3.5 -  5.3 mEq/L   Chloride 103 96 - 112 mEq/L   CO2 26 19 - 32 mEq/L   Glucose, Bld 86 70 - 99 mg/dL   BUN 13 6 - 23 mg/dL   Creat 1.610.80 0.960.50 - 0.451.10 mg/dL   Total Bilirubin 0.5 0.2 - 1.2 mg/dL   Alkaline Phosphatase 60 39 - 117 U/L   AST 18 0 - 37 U/L   ALT 16 0 - 35 U/L   Total Protein 7.5 6.0 - 8.3 g/dL   Albumin 4.3 3.5 - 5.2 g/dL   Calcium 9.4 8.4 - 40.910.5 mg/dL   GFR, Est African American >89 mL/min   GFR, Est Non African American >89 mL/min    Imaging Review No results found.   MDM   1. Umbilical bleeding    Plan of care discussed with Dr. Artis FlockKindl. Patient [facility] for further  evaluation via shuttle.  The patient verbalizes understanding and agrees to plan of care.       Servando Salinaatherine H Rossi, NP 01/20/14 1417  Servando Salinaatherine H Rossi, NP 01/20/14 [id number]

## 2014-01-20 NOTE — ED Provider Notes (Signed)
43yF with periumbilical pain and drainage. Hx of umbilical cyst s/p resection by general surgery over a year ago. I could not appreciate any drainage or obvious sinus tract on my exam. Bedside US does show superficial appearing fluid collection. Only local tenderness at site of lesion. No cellulitic changes. Will I&D. Given her hx, will have her follow-up with general surgery.   Raeford RazorStephen Limmie Schoenberg, MD 01/20/14 [phone]

## 2014-02-12 ENCOUNTER — Other Ambulatory Visit (INDEPENDENT_AMBULATORY_CARE_PROVIDER_SITE_OTHER): Payer: Self-pay | Admitting: Surgery

## 2014-08-08 ENCOUNTER — Encounter (HOSPITAL_COMMUNITY): Payer: Self-pay | Admitting: *Deleted

## 2014-08-08 ENCOUNTER — Emergency Department (INDEPENDENT_AMBULATORY_CARE_PROVIDER_SITE_OTHER)
Admission: EM | Admit: 2014-08-08 | Discharge: 2014-08-08 | Disposition: A | Discharge status: Nursing Home (Includes ICF) | Payer: Self-pay | Source: Home / Self Care | Attending: Family Medicine | Admitting: Family Medicine

## 2014-08-08 ENCOUNTER — Emergency Department (HOSPITAL_COMMUNITY)
Admission: EM | Admit: 2014-08-08 | Discharge: 2014-08-08 | Disposition: A | Discharge status: Home / Self Care | Payer: Self-pay | Attending: Emergency Medicine | Admitting: Emergency Medicine

## 2014-08-08 ENCOUNTER — Encounter (HOSPITAL_COMMUNITY): Payer: Self-pay | Admitting: Emergency Medicine

## 2014-08-08 DIAGNOSIS — Z90710 Acquired absence of both cervix and uterus: Secondary | ICD-10-CM | POA: Insufficient documentation

## 2014-08-08 DIAGNOSIS — Z79899 Other long term (current) drug therapy: Secondary | ICD-10-CM | POA: Insufficient documentation

## 2014-08-08 DIAGNOSIS — L729 Follicular cyst of the skin and subcutaneous tissue, unspecified: Secondary | ICD-10-CM | POA: Insufficient documentation

## 2014-08-08 DIAGNOSIS — Z8719 Personal history of other diseases of the digestive system: Secondary | ICD-10-CM | POA: Insufficient documentation

## 2014-08-08 DIAGNOSIS — IMO0002 Reserved for concepts with insufficient information to code with codable children: Secondary | ICD-10-CM

## 2014-08-08 DIAGNOSIS — Z87828 Personal history of other (healed) physical injury and trauma: Secondary | ICD-10-CM | POA: Insufficient documentation

## 2014-08-08 DIAGNOSIS — L02216 Cutaneous abscess of umbilicus: Secondary | ICD-10-CM

## 2014-08-08 LAB — COMPREHENSIVE METABOLIC PANEL
ALBUMIN: 3.6 g/dL (ref 3.5–5.0)
ALT: 17 U/L (ref 14–54)
AST: 19 U/L (ref 15–41)
Alkaline Phosphatase: 63 U/L (ref 38–126)
Anion gap: 6 (ref 5–15)
BUN: 10 mg/dL (ref 6–20)
CHLORIDE: 105 mmol/L (ref 101–111)
CO2: 26 mmol/L (ref 22–32)
Calcium: 8.9 mg/dL (ref 8.9–10.3)
Creatinine, Ser: 0.79 mg/dL (ref 0.44–1.00)
GFR calc Af Amer: >60 mL/min (ref >60)
GFR calc non Af Amer: >60 mL/min (ref >60)
Glucose, Bld: 93 mg/dL (ref 65–99)
POTASSIUM: 3.6 mmol/L (ref 3.5–5.1)
Sodium: 137 mmol/L (ref 135–145)
Total Bilirubin: 0.3 mg/dL (ref 0.3–1.2)
Total Protein: 6.9 g/dL (ref 6.5–8.1)

## 2014-08-08 LAB — CBC WITH DIFFERENTIAL/PLATELET
BASOS PCT: 0 % (ref 0–1)
Basophils Absolute: 0 10*3/uL (ref 0.0–0.1)
EOS PCT: 2 % (ref 0–5)
Eosinophils Absolute: 0.1 10*3/uL (ref 0.0–0.7)
HEMATOCRIT: 37.4 % (ref 36.0–46.0)
Hemoglobin: 12.8 g/dL (ref 12.0–15.0)
LYMPHS ABS: 2.2 10*3/uL (ref 0.7–4.0)
LYMPHS PCT: 35 % (ref 12–46)
MCH: 27.5 pg (ref 26.0–34.0)
MCHC: 34.2 g/dL (ref 30.0–36.0)
MCV: 80.3 fL (ref 78.0–100.0)
MONO ABS: 0.5 10*3/uL (ref 0.1–1.0)
Monocytes Relative: 8 % (ref 3–12)
Neutro Abs: 3.5 10*3/uL (ref 1.7–7.7)
Neutrophils Relative %: 55 % (ref 43–77)
Platelets: 157 10*3/uL (ref 150–400)
RBC: 4.66 MIL/uL (ref 3.87–5.11)
RDW: 14.3 % (ref 11.5–15.5)
WBC: 6.3 10*3/uL (ref 4.0–10.5)

## 2014-08-08 MED ORDER — MORPHINE SULFATE 4 MG/ML IJ SOLN: 4.0000 mg | Freq: Once | INTRAMUSCULAR | Status: DC

## 2014-08-08 MED ORDER — HYDROCODONE-ACETAMINOPHEN 5-325 MG PO TABS
1.0000 | ORAL_TABLET | Freq: Once | ORAL | Status: AC
  Administered 2014-08-08: 1 via ORAL
  Filled 2014-08-08: qty 1

## 2014-08-08 MED ORDER — CEPHALEXIN 250 MG PO CAPS
500.0000 mg | ORAL_CAPSULE | Freq: Once | ORAL | Status: AC
  Administered 2014-08-08: 500 mg via ORAL
  Filled 2014-08-08: qty 2

## 2014-08-08 MED ORDER — HYDROCODONE-ACETAMINOPHEN 5-325 MG PO TABS: 1.0000 | ORAL_TABLET | Freq: Four times a day (QID) | ORAL | Status: DC | PRN

## 2014-08-08 MED ORDER — CEPHALEXIN 500 MG PO CAPS: 500.0000 mg | ORAL_CAPSULE | Freq: Four times a day (QID) | ORAL | Status: DC

## 2014-08-08 NOTE — ED Provider Notes (Signed)
CSN: [id number]     Arrival date & time 08/08/14  1736 History   First MD Initiated Contact with Patient 08/08/14 1755     Chief Complaint  Patient presents with  . Abdominal Pain     (Consider location/radiation/quality/duration/timing/severity/associated sxs/prior Treatment) HPI Patient complains of pain redness and slight bleeding from umbilicus onset yesterday. Pain is 7 on a scale of 1-10. No treatment prior to coming here. No nausea no vomiting she is presently hungry. Last bowel movement yesterday, normal. No other associated symptoms nothing makes pain better or worse. [facility] prior to coming here sent here for further evaluation Past Medical History  Diagnosis Date  . H/O: hysterectomy   . Hernia, umbilical   . Complication of anesthesia     Headache after anesthesia in [place]- also did not wake up for 5 hours,.  . Leg wound, left     history as a child, was hospitalized for  6 months, Left leg now much smaller than right , painful at times   Past Surgical History  Procedure Laterality Date  . Abdominal hysterectomy    . Hernia repair    . Mass excision N/A 08/21/2012    Procedure: Excision of umbilical cyst, removal of umbilicus;  Surgeon: Shelly Rubenstein, MD;  Location: [facility] OR;  Service: General;  Laterality: N/A;  . Cyst removal trunk     Family History  Problem Relation Age of Onset  . Anesthesia problems Neg Hx    History  Substance Use Topics  . Smoking status: Never Smoker   . Smokeless tobacco: Never Used  . Alcohol Use: No   OB History    Gravida Para Term Preterm AB TAB SAB Ectopic Multiple Living       Review of Systems  Constitutional: Negative.   HENT: Negative.   Respiratory: Negative.   Cardiovascular: Negative.   Gastrointestinal: Positive for abdominal pain.  Musculoskeletal: Negative.   Skin: Negative.   Neurological: Negative.   Psychiatric/Behavioral: Negative.   All other systems  reviewed and are negative.     Allergies  Oxycodone  Home Medications   Prior to Admission medications   Medication Sig Start Date End Date Taking? Authorizing Provider  acetaminophen (TYLENOL) 325 MG tablet Take 650 mg by mouth every 6 (six) hours as needed (pain).   Yes Historical Provider, MD  hydrochlorothiazide (HYDRODIURIL) 12.5 MG tablet Take 1 tablet (12.5 mg total) by mouth daily. 01/02/14  Yes Deepak Advani, MD   BP 134/86 mmHg  Pulse 70  Temp(Src) 97.9 F (36.6 C) (Oral)  Resp 16  Ht 5' (1.524 m)  Wt 146 lb 1.6 oz (66.271 kg)  BMI 28.53 kg/m2  SpO2 100% Physical Exam  Constitutional: She appears well-developed and well-nourished. No distress.  HENT:  Head: Normocephalic and atraumatic.  Eyes: Conjunctivae are normal. Pupils are equal, round, and reactive to light.  Neck: Neck supple. No tracheal deviation present. No thyromegaly present.  Cardiovascular: Normal rate and regular rhythm.   No murmur heard. Pulmonary/Chest: Effort normal and breath sounds normal.  Abdominal: Soft. Bowel sounds are normal. She exhibits no distension. There is tenderness.  Surgical scar at umbilicus . Umbilicus is reddened, mildly tender, and slightly red for 5 cm area surrounding umbilicus.. No guarding and no rigidity.  Musculoskeletal: Normal range of motion. She exhibits no edema or tenderness.  Neurological: She is alert. Coordination normal.  Skin: Skin is warm  and dry. No rash noted.  Psychiatric: She has a normal mood and affect.  Nursing note and vitals reviewed.   ED Course  Procedures (including critical care time) Labs Review Labs Reviewed  CBC WITH DIFFERENTIAL/PLATELET  COMPREHENSIVE METABOLIC PANEL    Imaging Review No results found.   EKG Interpretation None     Results for orders placed or performed during the hospital encounter of 08/08/14  CBC with Differential  Result Value Ref Range   WBC 6.3 4.0 - 10.5 K/uL   RBC 4.66 3.87 - 5.11 MIL/uL    Hemoglobin 12.8 12.0 - 15.0 g/dL   HCT 96.7 59.1 - 63.8 %   MCV 80.3 78.0 - 100.0 fL   MCH 27.5 26.0 - 34.0 pg   MCHC 34.2 30.0 - 36.0 g/dL   RDW 46.6 59.9 - 35.7 %   Platelets 157 150 - 400 K/uL   Neutrophils Relative % 55 43 - 77 %   Neutro Abs 3.5 1.7 - 7.7 K/uL   Lymphocytes Relative 35 12 - 46 %   Lymphs Abs 2.2 0.7 - 4.0 K/uL   Monocytes Relative 8 3 - 12 %   Monocytes Absolute 0.5 0.1 - 1.0 K/uL   Eosinophils Relative 2 0 - 5 %   Eosinophils Absolute 0.1 0.0 - 0.7 K/uL   Basophils Relative 0 0 - 1 %   Basophils Absolute 0.0 0.0 - 0.1 K/uL  Comprehensive metabolic panel  Result Value Ref Range   Sodium 137 135 - 145 mmol/L   Potassium 3.6 3.5 - 5.1 mmol/L   Chloride 105 101 - 111 mmol/L   CO2 26 22 - 32 mmol/L   Glucose, Bld 93 65 - 99 mg/dL   BUN 10 6 - 20 mg/dL   Creatinine, Ser 0.17 0.44 - 1.00 mg/dL   Calcium 8.9 8.9 - 79.3 mg/dL   Total Protein 6.9 6.5 - 8.1 g/dL   Albumin 3.6 3.5 - 5.0 g/dL   AST 19 15 - 41 U/L   ALT 17 14 - 54 U/L   Alkaline Phosphatase 63 38 - 126 U/L   Total Bilirubin 0.3 0.3 - 1.2 mg/dL   GFR calc non Af Amer >60 >60 mL/min   GFR calc Af Amer >60 >60 mL/min   Anion gap 6 5 - 15   No results found.   MDM Final diagnoses:  None  patient exhibits no signs of peritonitis. She has no fever no leukocytosis she is presently hungry. This is likely a recurrence of her umbilical cyst.   I spoke with Dr. Lindie Spruce. Plan prescriptions Keflex, Norco. She is to call [facility] [place] surgery tomorrow to be seen by Dr. Magnus Ivan or the next available surgeon Diagnosis umbilical cyst      Doug Sou, MD 08/08/14 2142

## 2014-08-08 NOTE — ED Notes (Signed)
Pt presents via POV from urgent care for abdominal pain x 1 day. Pain is around umbilicus, pt sent due to absess.  Umbilical is red and tender.  Pt denies fever, nausea/vomiting/diarhea.  Pt states she noticed it yesterday when it started bleeding.  Pt also reports this is reoccurring and was last noted 8 months ago.  Pt a x 4, NAD.

## 2014-08-08 NOTE — Discharge Instructions (Signed)
Take Tylenol for mild pain or the pain medicine prescribed for bad pain. Call Dr. Eliberto Ivory office tomorrow morning at 8 AM to arrange to be seen in the office tomorrow by Dr. Magnus Ivan or by the surgeon on-call. Tell office staff that you were seen here and that Dr Ethelda Chick spoke with Dr.Wyatt about your case

## 2014-08-08 NOTE — ED Notes (Signed)
C/o abd pain since yesterday States pain is at [employer]  Denies any vomiting  States have normal stool

## 2014-08-08 NOTE — ED Provider Notes (Signed)
Audreana Lisbon is a 44 y.o. female who presents to Urgent Care today for severe abdominal pain. Patient developed a one-day history of severe abdominal pain yesterday. She notes pain and tenderness at her umbilicus. 3 years ago she had a similar complaint that required incision and drainage and CT scan. She denies any fevers or chills. She's had several abdominal surgeries included hysterectomy and a umbilical mass excision in 2014.   Past Medical History  Diagnosis Date  . H/O: hysterectomy   . Hernia, umbilical   . Complication of anesthesia     Headache after anesthesia in [place]- also did not wake up for 5 hours,.  . Leg wound, left     history as a child, was hospitalized for  6 months, Left leg now much smaller than right , painful at times   Past Surgical History  Procedure Laterality Date  . Abdominal hysterectomy    . Hernia repair    . Mass excision N/A 08/21/2012    Procedure: Excision of umbilical cyst, removal of umbilicus;  Surgeon: Shelly Rubenstein, MD;  Location: [facility] OR;  Service: General;  Laterality: N/A;  . Cyst removal trunk     History  Substance Use Topics  . Smoking status: Never Smoker   . Smokeless tobacco: Never Used  . Alcohol Use: No   ROS as above Medications: No current facility-administered medications for this encounter.   Current Outpatient Prescriptions  Medication Sig Dispense Refill  . acetaminophen (TYLENOL) 325 MG tablet Take 650 mg by mouth every 6 (six) hours as needed (pain).    Marland Kitchen amoxicillin (AMOXIL) 500 MG capsule Take 1 capsule (500 mg total) by mouth 3 (three) times daily. (Patient not taking: Reported on 01/20/2014) 30 capsule 0  . cephALEXin (KEFLEX) 500 MG capsule Take 1 capsule (500 mg total) by mouth 4 (four) times daily. 40 capsule 0  . hydrochlorothiazide (HYDRODIURIL) 12.5 MG tablet Take 1 tablet (12.5 mg total) by mouth daily. 30 tablet 3  . HYDROcodone-acetaminophen (NORCO/VICODIN) 5-325 MG per tablet Take 1-2 tablets by  mouth every 6 (six) hours as needed. 15 tablet 0  . Vitamin D, Ergocalciferol, (DRISDOL) 50000 UNITS CAPS capsule Take 1 capsule (50,000 Units total) by mouth every 7 (seven) days. (Patient not taking: Reported on 01/20/2014) 12 capsule 0   Allergies  Allergen Reactions  . Oxycodone Nausea And Vomiting    dizzyness      Exam:  BP 153/102 mmHg  Pulse 77  Temp(Src) 97.8 F (36.6 C) (Oral)  Resp 20  SpO2 98% Gen: Well NAD HEENT: EOMI,  MMM Lungs: Normal work of breathing. CTABL Heart: RRR no MRG Abd: NABS, umbilicus is indurated tender with fluctuant center. The belly is tender with some guarding and some rebound present. Exts: Brisk capillary refill, warm and well perfused.   No results found for this or any previous visit (from the past 24 hour(s)). No results found.  Assessment and Plan: 44 y.o. female with umbilical abscess with possible peritonitis. Transfer to ED for further evaluation and management. I do not feel the drainage of this abscess in the urgent care is warranted without further understanding of the extent of the infection.  Discussed warning signs or symptoms. Please see discharge instructions. Patient expresses understanding.     Rodolph Bong, MD 08/08/14 [phone]

## 2014-11-12 ENCOUNTER — Emergency Department (HOSPITAL_COMMUNITY): Payer: Self-pay

## 2014-11-12 ENCOUNTER — Encounter (HOSPITAL_COMMUNITY): Payer: Self-pay | Admitting: Emergency Medicine

## 2014-11-12 ENCOUNTER — Emergency Department (HOSPITAL_COMMUNITY)
Admission: EM | Admit: 2014-11-12 | Discharge: 2014-11-12 | Disposition: A | Discharge status: Home / Self Care | Payer: Self-pay | Attending: Emergency Medicine | Admitting: Emergency Medicine

## 2014-11-12 DIAGNOSIS — Q7959 Other congenital malformations of abdominal wall: Secondary | ICD-10-CM

## 2014-11-12 DIAGNOSIS — Z8719 Personal history of other diseases of the digestive system: Secondary | ICD-10-CM | POA: Insufficient documentation

## 2014-11-12 DIAGNOSIS — L03316 Cellulitis of umbilicus: Secondary | ICD-10-CM | POA: Insufficient documentation

## 2014-11-12 DIAGNOSIS — Z87828 Personal history of other (healed) physical injury and trauma: Secondary | ICD-10-CM | POA: Insufficient documentation

## 2014-11-12 LAB — CBC
HEMATOCRIT: 39.8 % (ref 36.0–46.0)
Hemoglobin: 13.8 g/dL (ref 12.0–15.0)
MCH: 27.8 pg (ref 26.0–34.0)
MCHC: 34.7 g/dL (ref 30.0–36.0)
MCV: 80.1 fL (ref 78.0–100.0)
Platelets: 155 10*3/uL (ref 150–400)
RBC: 4.97 MIL/uL (ref 3.87–5.11)
RDW: 14.4 % (ref 11.5–15.5)
WBC: 6 10*3/uL (ref 4.0–10.5)

## 2014-11-12 LAB — COMPREHENSIVE METABOLIC PANEL
ALK PHOS: 68 U/L (ref 38–126)
ALT: 19 U/L (ref 14–54)
AST: 23 U/L (ref 15–41)
Albumin: 3.8 g/dL (ref 3.5–5.0)
Anion gap: 8 (ref 5–15)
BILIRUBIN TOTAL: 0.7 mg/dL (ref 0.3–1.2)
BUN: 8 mg/dL (ref 6–20)
CALCIUM: 8.9 mg/dL (ref 8.9–10.3)
CO2: 26 mmol/L (ref 22–32)
Chloride: 102 mmol/L (ref 101–111)
Creatinine, Ser: 0.77 mg/dL (ref 0.44–1.00)
GFR calc Af Amer: >60 mL/min (ref >60)
GLUCOSE: 101 mg/dL — AB (ref 65–99)
POTASSIUM: 3.2 mmol/L — AB (ref 3.5–5.1)
Sodium: 136 mmol/L (ref 135–145)
TOTAL PROTEIN: 7.1 g/dL (ref 6.5–8.1)

## 2014-11-12 LAB — URINALYSIS, ROUTINE W REFLEX MICROSCOPIC
BILIRUBIN URINE: NEGATIVE
Glucose, UA: NEGATIVE mg/dL
HGB URINE DIPSTICK: NEGATIVE
Ketones, ur: NEGATIVE mg/dL
Leukocytes, UA: NEGATIVE
NITRITE: NEGATIVE
PROTEIN: NEGATIVE mg/dL
SPECIFIC GRAVITY, URINE: 1.012 (ref 1.005–1.030)
Urobilinogen, UA: 1 mg/dL (ref 0.0–1.0)
pH: 6.5 (ref 5.0–8.0)

## 2014-11-12 LAB — LIPASE, BLOOD: Lipase: 41 U/L (ref 22–51)

## 2014-11-12 MED ORDER — CLINDAMYCIN HCL 300 MG PO CAPS: 300.0000 mg | ORAL_CAPSULE | Freq: Four times a day (QID) | ORAL | Status: DC

## 2014-11-12 MED ORDER — LIDOCAINE HCL (PF) 1 % IJ SOLN
INTRAMUSCULAR | Status: AC
  Filled 2014-11-12: qty 5

## 2014-11-12 MED ORDER — FENTANYL CITRATE (PF) 100 MCG/2ML IJ SOLN
50.0000 ug | Freq: Once | INTRAMUSCULAR | Status: AC
  Administered 2014-11-12: 50 ug via INTRAVENOUS
  Filled 2014-11-12: qty 2

## 2014-11-12 NOTE — ED Notes (Signed)
Dressing changed to abdomen. Patient given supplies for dressing changes at home. Signs and symptoms of increased infection gone over with patient.

## 2014-11-12 NOTE — ED Notes (Signed)
This RN wasted of Fentanyl, witnessed by Willene Hatchet, RN

## 2014-11-12 NOTE — ED Notes (Signed)
Patient states abdominal pain since Saturday.   Patient states started having bleeding from her naval yesterday.   Patient states no urinary symptoms.   Denies other symptoms.

## 2014-11-12 NOTE — ED Notes (Signed)
This RN wasted of Fentanyl several hours after pt discharge.  No longer available in pyxis.  Witnessed by Willene Hatchet, RN

## 2014-11-12 NOTE — Discharge Instructions (Signed)

## 2014-11-12 NOTE — ED Notes (Signed)
MD at bedside with I&D kit and lidocaine

## 2014-11-12 NOTE — ED Provider Notes (Signed)
Patient's CT shows no abscess, does show cellulitis. Dr. Corliss Skains recommends f/u with [facility] as outpatient and oral antibiotics.  Results for orders placed or performed during the hospital encounter of 11/12/14  Lipase, blood  Result Value Ref Range   Lipase 41 22 - 51 U/L  Comprehensive metabolic panel  Result Value Ref Range   Sodium 136 135 - 145 mmol/L   Potassium 3.2 (L) 3.5 - 5.1 mmol/L   Chloride 102 101 - 111 mmol/L   CO2 26 22 - 32 mmol/L   Glucose, Bld 101 (H) 65 - 99 mg/dL   BUN 8 6 - 20 mg/dL   Creatinine, Ser 0.98 0.44 - 1.00 mg/dL   Calcium 8.9 8.9 - 11.9 mg/dL   Total Protein 7.1 6.5 - 8.1 g/dL   Albumin 3.8 3.5 - 5.0 g/dL   AST 23 15 - 41 U/L   ALT 19 14 - 54 U/L   Alkaline Phosphatase 68 38 - 126 U/L   Total Bilirubin 0.7 0.3 - 1.2 mg/dL   GFR calc non Af Amer >60 >60 mL/min   GFR calc Af Amer >60 >60 mL/min   Anion gap 8 5 - 15  CBC  Result Value Ref Range   WBC 6.0 4.0 - 10.5 K/uL   RBC 4.97 3.87 - 5.11 MIL/uL   Hemoglobin 13.8 12.0 - 15.0 g/dL   HCT 14.7 82.9 - 56.2 %   MCV 80.1 78.0 - 100.0 fL   MCH 27.8 26.0 - 34.0 pg   MCHC 34.7 30.0 - 36.0 g/dL   RDW 13.0 86.5 - 78.4 %   Platelets 155 150 - 400 K/uL  Urinalysis, Routine w reflex microscopic (not at [facility])  Result Value Ref Range   Color, Urine YELLOW YELLOW   APPearance CLEAR CLEAR   Specific Gravity, Urine 1.012 1.005 - 1.030   pH 6.5 5.0 - 8.0   Glucose, UA NEGATIVE NEGATIVE mg/dL   Hgb urine dipstick NEGATIVE NEGATIVE   Bilirubin Urine NEGATIVE NEGATIVE   Ketones, ur NEGATIVE NEGATIVE mg/dL   Protein, ur NEGATIVE NEGATIVE mg/dL   Urobilinogen, UA 1.0 0.0 - 1.0 mg/dL   Nitrite NEGATIVE NEGATIVE   Leukocytes, UA NEGATIVE NEGATIVE   Ct Renal Stone Study  11/12/2014   CLINICAL DATA:  Umbilical pain starting Saturday  EXAM: CT ABDOMEN AND PELVIS WITHOUT CONTRAST  TECHNIQUE: Multidetector CT imaging of the abdomen and pelvis was performed following the standard protocol without IV contrast.   COMPARISON:  11/29/2011  FINDINGS: Lung bases are unremarkable. Sagittal images of the spine shows mild degenerative changes lower thoracic spine. Unenhanced liver, spleen, pancreas and adrenal glands are unremarkable. No calcified gallstones are noted within gallbladder. Unenhanced kidneys shows no nephrolithiasis. No hydronephrosis or hydroureter. No calcified ureteral calculi.  No small bowel obstruction. No ascites or free air. No adenopathy. There is no pericecal inflammation. The terminal ileum is unremarkable. There is some vaginal fluid noted. The patient is status post hysterectomy.  There is soft tissue density in umbilical region again noted. Measures about 2.4 by 2 cm. This is highly suspicious for recurrent cellulitis. Less likely neoplastic process but not entirely excluded. Clinical correlation is necessary. No drainable abscess is noted.  IMPRESSION: 1. There is soft tissue density in umbilical region measures 2.4 x 2 cm. This is highly suspicious for recurrent cellulitis. Less likely neoplastic process but not entirely excluded. Clinical correlation is necessary. No abscess is noted. 2. No nephrolithiasis.  No hydronephrosis or hydroureter. 3. No pericecal inflammation. 4.  No small bowel obstruction. 5. Nonspecific vaginal fluid is noted.  Status post hysterectomy.   Electronically Signed   By: Natasha Mead M.D.   On: 11/12/2014 17:53      Pricilla Loveless, MD 11/12/14 1827

## 2014-11-12 NOTE — ED Provider Notes (Signed)
CSN: [id number]     Arrival date & time 11/12/14  1004 History   First MD Initiated Contact with Patient 11/12/14 1321     Chief Complaint  Patient presents with  . Abdominal Pain     (Consider location/radiation/quality/duration/timing/severity/associated sxs/prior Treatment) HPI Comments: 44 yo female with history of hysterectomy and umbilical hernia with associated chronic abdominal pain presents to the ED with 3 days periumbilical pain with associated back pain. Patient has a cyst at the umbilical site that was bleeding as of last night. She has been to her surgeon for this issue in the past and was told her insurance no longer covers her there and to visit the ED when it starts to bleed. Patient describes the bleeding as mild and stopped on its own. She reports feeling more sweaty the past 3 days but denies any fevers, chills, nausea or vomiting. She's had normal BM and is passing flatus. She denies any urinary symptoms. She denies any weakness, fatigue or lightheadedness.  She denies any purulent discharge.  Patient is a 44 y.o. female presenting with abdominal pain. The history is provided by the patient.  Abdominal Pain   Past Medical History  Diagnosis Date  . H/O: hysterectomy   . Hernia, umbilical   . Complication of anesthesia     Headache after anesthesia in [place]- also did not wake up for 5 hours,.  . Leg wound, left     history as a child, was hospitalized for  6 months, Left leg now much smaller than right , painful at times   Past Surgical History  Procedure Laterality Date  . Abdominal hysterectomy    . Hernia repair    . Mass excision N/A 08/21/2012    Procedure: Excision of umbilical cyst, removal of umbilicus;  Surgeon: Shelly Rubenstein, MD;  Location: [facility] OR;  Service: General;  Laterality: N/A;  . Cyst removal trunk     Family History  Problem Relation Age of Onset  . Anesthesia problems Neg Hx    Social History  Substance Use Topics  . Smoking status:  Never Smoker   . Smokeless tobacco: Never Used  . Alcohol Use: No   OB History    Gravida Para Term Preterm AB TAB SAB Ectopic Multiple Living       Review of Systems  Gastrointestinal: Positive for abdominal pain.      Allergies  Oxycodone  Home Medications   Prior to Admission medications   Medication Sig Start Date End Date Taking? Authorizing Provider  acetaminophen (TYLENOL) 325 MG tablet Take 650 mg by mouth every 6 (six) hours as needed (pain).   Yes Historical Provider, MD  cephALEXin (KEFLEX) 500 MG capsule Take 1 capsule (500 mg total) by mouth 4 (four) times daily. Patient not taking: Reported on 11/12/2014 08/08/14   Doug Sou, MD  hydrochlorothiazide (HYDRODIURIL) 12.5 MG tablet Take 1 tablet (12.5 mg total) by mouth daily. Patient not taking: Reported on 11/12/2014 01/02/14   Doris Cheadle, MD  HYDROcodone-acetaminophen (NORCO) 5-325 MG per tablet Take 1 tablet by mouth every 6 (six) hours as needed for severe pain. Patient not taking: Reported on 11/12/2014 08/08/14   Doug Sou, MD   BP 132/95 mmHg  Pulse 77  Temp(Src) 98.2 F (36.8 C) (Oral)  Resp 16  Wt 145 lb 2 oz (65.828 kg)  SpO2 100% Physical Exam  Constitutional: She is oriented to person, place, and time. She appears  well-developed.  HENT:  Head: Normocephalic and atraumatic.  Eyes: EOM are normal.  Neck: Normal range of motion. Neck supple.  Cardiovascular: Normal rate.   Pulmonary/Chest: Effort normal.  Abdominal: Bowel sounds are normal.  Pt has abdominal wall induration around the surgical site, with some hypertrophy of the skin and scarring. There is mild erythema surrounding the site. There is no fluctuance.   Neurological: She is alert and oriented to person, place, and time.  Skin: Skin is warm and dry.  Nursing note and vitals reviewed.   ED Course  Procedures (including critical care time)  ULTRASOUND LIMITED SOFT TISSUE/ MUSCULOSKELETAL: Abdominal  wall Indication: Abscess evaluation Linear probe used to evaluate area of interest in two planes. Findings:  There is a fluid filled pocket. Performed by: Dr Rhunette Croft Images saved electronically   INCISION AND DRAINAGE Performed by: Derwood Kaplan Consent: Verbal consent obtained. Risks and benefits: risks, benefits and alternatives were discussed Type: abscess  Body area: abdominal wall  Anesthesia: local infiltration  Incision was made with a scalpel.  Local anesthetic: lidocaine 1 % w/o epinephrine  Anesthetic total: 5 ml  Complexity: complex Blunt dissection to break up loculations  Drainage: sanguinous  Drainage amount: 5  Packing material: 1/4 in iodoform gauze  Patient tolerance: Patient tolerated the procedure well with no immediate complications.      Labs Review Labs Reviewed  COMPREHENSIVE METABOLIC PANEL - Abnormal; Notable for the following:    Potassium 3.2 (*)    Glucose, Bld 101 (*)    All other components within normal limits  LIPASE, BLOOD  CBC  URINALYSIS, ROUTINE W REFLEX MICROSCOPIC (NOT AT [facility])    Imaging Review No results found. I have personally reviewed and evaluated these images and lab results as part of my medical decision-making.   EKG Interpretation None      MDM   Final diagnoses:  Abdominal wall anomaly    Pt with hx of abdominal wall abscess and cyst.  Previous surgical notes reviewed. She comes in with bleeding from the site, with pain and increased swelling x 3 days.  Bedside US is equivocal.  We spoke with Dr. Andrey Campanile, Surgery- and he recommended lancing the site to see what the drainage is. We attempted the procedure, and we had bloody drainage only. With the scar tissue, it was hard to penetrate all the way through the superficial area and we were unable to dissect with the hemostats.  Plan is to get CT abdomen w/o contrast to look at the morphology of the lesion. Dr. Andrey Campanile Surgery aware of the  plan  Dr. Criss Alvine to f/u.  If concerning findings - consult surgery. If the findings are not concerning, d/c with antibiotics and surgery f/u.  Labs are reassuring.    Derwood Kaplan, MD 11/12/14 1651

## 2014-11-23 DIAGNOSIS — N80C2 Endometriosis of the umbilicus: Secondary | ICD-10-CM

## 2014-11-23 DIAGNOSIS — N808 Other endometriosis: Secondary | ICD-10-CM

## 2014-11-23 HISTORY — DX: Endometriosis of the umbilicus: N80.C2

## 2014-11-23 HISTORY — DX: Other endometriosis: N80.8

## 2014-11-26 ENCOUNTER — Other Ambulatory Visit: Payer: Self-pay | Admitting: Surgery

## 2014-12-12 ENCOUNTER — Encounter (HOSPITAL_BASED_OUTPATIENT_CLINIC_OR_DEPARTMENT_OTHER): Payer: Self-pay

## 2014-12-12 ENCOUNTER — Ambulatory Visit (HOSPITAL_BASED_OUTPATIENT_CLINIC_OR_DEPARTMENT_OTHER): Admit: 2014-12-12 | Payer: Self-pay | Admitting: Surgery

## 2014-12-12 SURGERY — BREAST LUMPECTOMY WITH RADIOACTIVE SEED AND SENTINEL LYMPH NODE BIOPSY
Anesthesia: General | Laterality: Left

## 2014-12-19 ENCOUNTER — Encounter (HOSPITAL_BASED_OUTPATIENT_CLINIC_OR_DEPARTMENT_OTHER): Payer: Self-pay | Admitting: *Deleted

## 2014-12-23 NOTE — H&P (Signed)
  Anne [name] 11/26/2014 9:53 AM Location: [facility] Patient #: [id number] DOB: Feb 20, 1971 Single / Language: [place] / Race: Black or African American Female   History of Present Illness (Anne [name] A. Magnus IvanBlackman MD; 11/26/2014 10:26 AM) Patient words: Umbilical bleeding.  The patient is a 44 year old female who presents with a complaint of Mass. She is well known to me status post excision of an umbilical cyst in 2014 which turned out to be an endometrioma. She had persistent chronic infections in this area prior surgery. She now has a recurrence of this. I recommended total excision of her umbilicus last year she decided to hold on surgery. She has had at least 2 separate episodes of bleeding of the umbilicus over the past year. She also has focal umbilical pain. A recent CAT scan showed no evidence of her recurrent cyst or hernia  PMHx:  Unchanged  Allergies (Anne [name], CMA; 11/26/2014 9:54 AM) OxyCODONE HCl *ANALGESICS - OPIOID*  Medication History (Anne [name], CMA; 11/26/2014 9:54 AM) Hydrochlorothiazide (12.5MG  Capsule, Oral) Active. Medications Reconciled  Vitals (Anne [name] CMA; 11/26/2014 9:54 AM) 11/26/2014 9:54 AM Weight: 147.6 lb Height: 68in Body Surface Area: 1.79 m Body Mass Index: 22.44 kg/m  Temp.: 98.17F(Oral)  Pulse: 74 (Regular)  BP: 110/80 (Sitting, Left Arm, Standard)     Physical Exam (Omarr Hann A. Magnus IvanBlackman MD; 11/26/2014 10:26 AM) The physical exam findings are as follows: Well in appearance Lungs clear CV RRR Abdomen soft Note:On exam today there is no evidence of cellulitis. The scar of the umbilicus is quite firm and she has a keloid as well    Assessment & Plan (Talyia Allende A. 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 She  understands and wishes to proceed with surgery. I discussed the risks with her again including the risks of infection and recurrance    Signed by Shelly Rubensteinouglas A Lothar Prehn, MD (11/26/2014 10:27 AM)

## 2014-12-24 ENCOUNTER — Encounter (HOSPITAL_BASED_OUTPATIENT_CLINIC_OR_DEPARTMENT_OTHER)
Admission: RE | Disposition: A | Discharge status: Home / Self Care | Payer: Self-pay | Source: Ambulatory Visit | Attending: Surgery

## 2014-12-24 ENCOUNTER — Ambulatory Visit (HOSPITAL_BASED_OUTPATIENT_CLINIC_OR_DEPARTMENT_OTHER)
Admission: RE | Admit: 2014-12-24 | Discharge: 2014-12-24 | Disposition: A | Discharge status: Home / Self Care | Payer: Self-pay | Source: Ambulatory Visit | Attending: Surgery | Admitting: Surgery

## 2014-12-24 ENCOUNTER — Ambulatory Visit (HOSPITAL_BASED_OUTPATIENT_CLINIC_OR_DEPARTMENT_OTHER): Payer: Self-pay | Admitting: Certified Registered"

## 2014-12-24 ENCOUNTER — Encounter (HOSPITAL_BASED_OUTPATIENT_CLINIC_OR_DEPARTMENT_OTHER): Payer: Self-pay

## 2014-12-24 DIAGNOSIS — N808 Other endometriosis: Secondary | ICD-10-CM | POA: Insufficient documentation

## 2014-12-24 DIAGNOSIS — I10 Essential (primary) hypertension: Secondary | ICD-10-CM | POA: Insufficient documentation

## 2014-12-24 HISTORY — PX: MASS EXCISION: SHX2000

## 2014-12-24 HISTORY — DX: Other endometriosis: N80.8

## 2014-12-24 SURGERY — EXCISION MASS
Anesthesia: General | Site: Abdomen

## 2014-12-24 MED ORDER — DEXAMETHASONE SODIUM PHOSPHATE 10 MG/ML IJ SOLN
INTRAMUSCULAR | Status: AC
  Filled 2014-12-24: qty 1

## 2014-12-24 MED ORDER — SCOPOLAMINE 1 MG/3DAYS TD PT72: 1.0000 | MEDICATED_PATCH | Freq: Once | TRANSDERMAL | Status: DC | PRN

## 2014-12-24 MED ORDER — LIDOCAINE HCL (CARDIAC) 20 MG/ML IV SOLN
INTRAVENOUS | Status: DC | PRN
  Administered 2014-12-24: 50 mg via INTRAVENOUS

## 2014-12-24 MED ORDER — BACITRACIN-NEOMYCIN-POLYMYXIN 400-5-5000 EX OINT
TOPICAL_OINTMENT | CUTANEOUS | Status: AC
  Filled 2014-12-24: qty 1

## 2014-12-24 MED ORDER — KETOROLAC TROMETHAMINE 30 MG/ML IJ SOLN
INTRAMUSCULAR | Status: DC | PRN
  Administered 2014-12-24: 30 mg via INTRAVENOUS

## 2014-12-24 MED ORDER — HYDROCODONE-ACETAMINOPHEN 5-325 MG PO TABS: 1.0000 | ORAL_TABLET | ORAL | Status: DC | PRN

## 2014-12-24 MED ORDER — PROPOFOL 10 MG/ML IV BOLUS
INTRAVENOUS | Status: DC | PRN
  Administered 2014-12-24: 200 mg via INTRAVENOUS

## 2014-12-24 MED ORDER — HYDROMORPHONE HCL 1 MG/ML IJ SOLN: 0.2500 mg | INTRAMUSCULAR | Status: DC | PRN

## 2014-12-24 MED ORDER — LIDOCAINE HCL (CARDIAC) 20 MG/ML IV SOLN
INTRAVENOUS | Status: AC
  Filled 2014-12-24: qty 5

## 2014-12-24 MED ORDER — MIDAZOLAM HCL 2 MG/2ML IJ SOLN
1.0000 mg | INTRAMUSCULAR | Status: DC | PRN
  Administered 2014-12-24: 2 mg via INTRAVENOUS

## 2014-12-24 MED ORDER — ATROPINE SULFATE 0.4 MG/ML IJ SOLN
INTRAMUSCULAR | Status: AC
Start: 2014-12-24 — End: 2014-12-24
  Filled 2014-12-24: qty 1

## 2014-12-24 MED ORDER — GLYCOPYRROLATE 0.2 MG/ML IJ SOLN: 0.2000 mg | Freq: Once | INTRAMUSCULAR | Status: DC | PRN

## 2014-12-24 MED ORDER — FENTANYL CITRATE (PF) 100 MCG/2ML IJ SOLN
INTRAMUSCULAR | Status: AC
  Filled 2014-12-24: qty 4

## 2014-12-24 MED ORDER — PROPOFOL 500 MG/50ML IV EMUL
INTRAVENOUS | Status: AC
  Filled 2014-12-24: qty 50

## 2014-12-24 MED ORDER — ROCURONIUM BROMIDE 50 MG/5ML IV SOLN
INTRAVENOUS | Status: AC
  Filled 2014-12-24: qty 1

## 2014-12-24 MED ORDER — MEPERIDINE HCL 25 MG/ML IJ SOLN: 6.2500 mg | INTRAMUSCULAR | Status: DC | PRN

## 2014-12-24 MED ORDER — ACETAMINOPHEN 325 MG PO TABS: 650.0000 mg | ORAL_TABLET | ORAL | Status: DC | PRN

## 2014-12-24 MED ORDER — MORPHINE SULFATE (PF) 2 MG/ML IV SOLN: 1.0000 mg | INTRAVENOUS | Status: DC | PRN

## 2014-12-24 MED ORDER — DEXAMETHASONE SODIUM PHOSPHATE 4 MG/ML IJ SOLN
INTRAMUSCULAR | Status: DC | PRN
  Administered 2014-12-24: 10 mg via INTRAVENOUS

## 2014-12-24 MED ORDER — MIDAZOLAM HCL 2 MG/2ML IJ SOLN
INTRAMUSCULAR | Status: AC
  Filled 2014-12-24: qty 4

## 2014-12-24 MED ORDER — KETOROLAC TROMETHAMINE 30 MG/ML IJ SOLN
INTRAMUSCULAR | Status: AC
  Filled 2014-12-24: qty 1

## 2014-12-24 MED ORDER — PROMETHAZINE HCL 25 MG/ML IJ SOLN: 6.2500 mg | INTRAMUSCULAR | Status: DC | PRN

## 2014-12-24 MED ORDER — CEFAZOLIN SODIUM-DEXTROSE 2-3 GM-% IV SOLR
INTRAVENOUS | Status: AC
  Filled 2014-12-24: qty 50

## 2014-12-24 MED ORDER — CEFAZOLIN SODIUM-DEXTROSE 2-3 GM-% IV SOLR
2.0000 g | INTRAVENOUS | Status: AC
  Administered 2014-12-24 (×2): 2 g via INTRAVENOUS

## 2014-12-24 MED ORDER — FENTANYL CITRATE (PF) 100 MCG/2ML IJ SOLN
50.0000 ug | INTRAMUSCULAR | Status: DC | PRN
  Administered 2014-12-24: 100 ug via INTRAVENOUS

## 2014-12-24 MED ORDER — BUPIVACAINE-EPINEPHRINE (PF) 0.5% -1:200000 IJ SOLN
INTRAMUSCULAR | Status: AC
  Filled 2014-12-24: qty 120

## 2014-12-24 MED ORDER — SUCCINYLCHOLINE CHLORIDE 20 MG/ML IJ SOLN
INTRAMUSCULAR | Status: AC
Start: 2014-12-24 — End: 2014-12-24
  Filled 2014-12-24: qty 1

## 2014-12-24 MED ORDER — ACETAMINOPHEN 650 MG RE SUPP: 650.0000 mg | RECTAL | Status: DC | PRN

## 2014-12-24 MED ORDER — ONDANSETRON HCL 4 MG/2ML IJ SOLN
INTRAMUSCULAR | Status: DC | PRN
  Administered 2014-12-24: 4 mg via INTRAVENOUS

## 2014-12-24 MED ORDER — SODIUM CHLORIDE 0.9 % IJ SOLN: 3.0000 mL | INTRAMUSCULAR | Status: DC | PRN

## 2014-12-24 MED ORDER — BUPIVACAINE-EPINEPHRINE 0.5% -1:200000 IJ SOLN
INTRAMUSCULAR | Status: DC | PRN
  Administered 2014-12-24: 30 mL

## 2014-12-24 MED ORDER — SODIUM CHLORIDE 0.9 % IJ SOLN: 3.0000 mL | Freq: Two times a day (BID) | INTRAMUSCULAR | Status: DC

## 2014-12-24 MED ORDER — LACTATED RINGERS IV SOLN
INTRAVENOUS | Status: DC
  Administered 2014-12-24 (×2): via INTRAVENOUS

## 2014-12-24 MED ORDER — GLYCOPYRROLATE 0.2 MG/ML IJ SOLN
INTRAMUSCULAR | Status: AC
  Filled 2014-12-24: qty 1

## 2014-12-24 MED ORDER — ONDANSETRON HCL 4 MG/2ML IJ SOLN
INTRAMUSCULAR | Status: AC
  Filled 2014-12-24: qty 2

## 2014-12-24 MED ORDER — SODIUM CHLORIDE 0.9 % IV SOLN: 250.0000 mL | INTRAVENOUS | Status: DC | PRN

## 2014-12-24 SURGICAL SUPPLY — 45 items
BLADE CLIPPER SURG (BLADE) IMPLANT
BLADE HEX COATED 2.75 (ELECTRODE) ×3 IMPLANT
BLADE SURG 15 STRL LF DISP TIS (BLADE) ×1 IMPLANT
BLADE SURG 15 STRL SS (BLADE) ×2
CANISTER SUCT 1200ML W/VALVE (MISCELLANEOUS) IMPLANT
CHLORAPREP W/TINT 26ML (MISCELLANEOUS) ×3 IMPLANT
COVER BACK TABLE 60X90IN (DRAPES) ×3 IMPLANT
COVER MAYO STAND STRL (DRAPES) ×3 IMPLANT
DECANTER SPIKE VIAL GLASS SM (MISCELLANEOUS) IMPLANT
DRAPE LAPAROTOMY 100X72 PEDS (DRAPES) ×3 IMPLANT
DRAPE UTILITY XL STRL (DRAPES) ×3 IMPLANT
ELECT REM PT RETURN 9FT ADLT (ELECTROSURGICAL) ×3
ELECTRODE REM PT RTRN 9FT ADLT (ELECTROSURGICAL) ×1 IMPLANT
GLOVE BIO SURGEON STRL SZ7.5 (GLOVE) ×3 IMPLANT
GLOVE BIOGEL PI IND STRL 7.0 (GLOVE) ×1 IMPLANT
GLOVE BIOGEL PI IND STRL 7.5 (GLOVE) ×1 IMPLANT
GLOVE BIOGEL PI INDICATOR 7.0 (GLOVE) ×2
GLOVE BIOGEL PI INDICATOR 7.5 (GLOVE) ×2
GLOVE ECLIPSE 6.5 STRL STRAW (GLOVE) ×3 IMPLANT
GLOVE SURG SIGNA 7.5 PF LTX (GLOVE) ×3 IMPLANT
GOWN STRL REUS W/ TWL LRG LVL3 (GOWN DISPOSABLE) ×1 IMPLANT
GOWN STRL REUS W/ TWL XL LVL3 (GOWN DISPOSABLE) IMPLANT
GOWN STRL REUS W/TWL LRG LVL3 (GOWN DISPOSABLE) ×2
GOWN STRL REUS W/TWL XL LVL3 (GOWN DISPOSABLE)
LIQUID BAND (GAUZE/BANDAGES/DRESSINGS) ×6 IMPLANT
NEEDLE HYPO 25X1 1.5 SAFETY (NEEDLE) ×3 IMPLANT
NS IRRIG 1000ML POUR BTL (IV SOLUTION) IMPLANT
PACK BASIN DAY SURGERY FS (CUSTOM PROCEDURE TRAY) ×3 IMPLANT
PENCIL BUTTON HOLSTER BLD 10FT (ELECTRODE) ×3 IMPLANT
SLEEVE SCD COMPRESS KNEE MED (MISCELLANEOUS) ×3 IMPLANT
SPONGE LAP 4X18 X RAY DECT (DISPOSABLE) ×3 IMPLANT
SUT MNCRL AB 4-0 PS2 18 (SUTURE) IMPLANT
SUT PROLENE 3 0 PS 2 (SUTURE) IMPLANT
SUT VIC AB 2-0 SH 27 (SUTURE)
SUT VIC AB 2-0 SH 27XBRD (SUTURE) IMPLANT
SUT VIC AB 3-0 SH 27 (SUTURE)
SUT VIC AB 3-0 SH 27X BRD (SUTURE) IMPLANT
SYR BULB 3OZ (MISCELLANEOUS) IMPLANT
SYR CONTROL 10ML LL (SYRINGE) ×3 IMPLANT
TOWEL OR 17X24 6PK STRL BLUE (TOWEL DISPOSABLE) ×3 IMPLANT
TOWEL OR NON WOVEN STRL DISP B (DISPOSABLE) ×3 IMPLANT
TRAY DSU PREP LF (CUSTOM PROCEDURE TRAY) IMPLANT
TUBE CONNECTING 20'X1/4 (TUBING)
TUBE CONNECTING 20X1/4 (TUBING) IMPLANT
YANKAUER SUCT BULB TIP NO VENT (SUCTIONS) IMPLANT

## 2014-12-24 NOTE — Discharge Instructions (Signed)
Ok to shower starting tomorrow  Ice pack and ibuprofen/Advil also for pain  No lifting more than 15 to 20 pounds for 4 weeks   Call your surgeon if you experience:   1.  Fever over 101.0. 2.  Inability to urinate. 3.  Nausea and/or vomiting. 4.  Extreme swelling or bruising at the surgical site. 5.  Continued bleeding from the incision. 6.  Increased pain, redness or drainage from the incision. 7.  Problems related to your pain medication. 8. Any change in color, movement and/or sensation 9. Any problems and/or concerns   Post Anesthesia Home Care Instructions  Activity: Get plenty of rest for the remainder of the day. A responsible adult should stay with you for 24 hours following the procedure.  For the next 24 hours, DO NOT: -Drive a car -Advertising copywriterperate machinery -Drink alcoholic beverages -Take any medication unless instructed by your physician -Make any legal decisions or sign important papers.  Meals: Start with liquid foods such as gelatin or soup. Progress to regular foods as tolerated. Avoid greasy, spicy, heavy foods. If nausea and/or vomiting occur, drink only clear liquids until the nausea and/or vomiting subsides. Call your physician if vomiting continues.  Special Instructions/Symptoms: Your throat may feel dry or sore from the anesthesia or the breathing tube placed in your throat during surgery. If this causes discomfort, gargle with warm salt water. The discomfort should disappear within 24 hours.  If you had a scopolamine patch placed behind your ear for the management of post- operative nausea and/or vomiting:  1. The medication in the patch is effective for 72 hours, after which it should be removed.  Wrap patch in a tissue and discard in the trash. Wash hands thoroughly with soap and water. 2. You may remove the patch earlier than 72 hours if you experience unpleasant side effects which may include dry mouth, dizziness or visual disturbances. 3. Avoid touching  the patch. Wash your hands with soap and water after contact with the patch.

## 2014-12-24 NOTE — Op Note (Signed)
EXCISION UMBILICAL MASS  Procedure Note  [name] 12/24/2014   Pre-op Diagnosis: Umbilical mass     Post-op Diagnosis: same  Procedure(s): EXCISION UMBILICAL MASS  Surgeon(s): Abigail Miyamotoouglas Ladarian Bonczek, MD  Anesthesia: General  Staff:  Circulator: Randalyn RheaPattie T Caviness, RN Relief Circulator: Herminio CommonsMonica Greene V, RN Relief Scrub: Raliegh ScarletJudy G Burroughs, RN Scrub Person: Salley ScarletMary B Davidson, RN  Estimated Blood Loss: Minimal               Specimens: sent to path          [facility] HospitalBLACKMAN,[name]   Date: 12/24/2014  Time: 11:47 AM

## 2014-12-24 NOTE — Interval H&P Note (Signed)
History and Physical Interval Note: no change in H and P  12/24/2014 10:38 AM  [name]  has presented today for surgery, with the diagnosis of Umbilical Endometriosis  The various methods of treatment have been discussed with the patient and family. After consideration of risks, benefits and other options for treatment, the patient has consented to  Procedure(s): EXCISION UMBILICAL MASS (N/A) as a surgical intervention .  The patient's history has been reviewed, patient examined, no change in status, stable for surgery.  I have reviewed the patient's chart and labs.  Questions were answered to the patient's satisfaction.     Williamson Cavanah A

## 2014-12-24 NOTE — Transfer of Care (Signed)
Immediate Anesthesia Transfer of Care Note  Patient: Anne ScotlandJustine [name]  Procedure(s) Performed: Procedure(s) with comments: EXCISION UMBILICAL MASS (N/A) - umbilical  Patient Location: PACU  Anesthesia Type:General  Level of Consciousness: sedated  Airway & Oxygen Therapy: Patient Spontanous Breathing and Patient connected to face mask oxygen  Post-op Assessment: Report given to RN and Post -op Vital signs reviewed and stable  Post vital signs: Reviewed and stable  Last Vitals:  Filed Vitals:   12/24/14 1039  BP: 134/91  Pulse: 84  Temp: 36.9 C  Resp: 18    Complications: No apparent anesthesia complications

## 2014-12-24 NOTE — Anesthesia Preprocedure Evaluation (Signed)
Anesthesia Evaluation  Patient identified by MRN, date of birth, ID band Patient awake    Reviewed: Allergy & Precautions, H&P , NPO status , Patient's Chart, lab work & pertinent test results  Airway Mallampati: II   Neck ROM: full    Dental  (+) Dental Advisory Given   Pulmonary neg pulmonary ROS,    breath sounds clear to auscultation       Cardiovascular hypertension, negative cardio ROS   Rhythm:Regular     Neuro/Psych negative neurological ROS  negative psych ROS   GI/Hepatic negative GI ROS, Neg liver ROS,   Endo/Other  negative endocrine ROS  Renal/GU negative Renal ROS     Musculoskeletal negative musculoskeletal ROS (+)   Abdominal   Peds  Hematology negative hematology ROS (+)   Anesthesia Other Findings   Reproductive/Obstetrics negative OB ROS                             Anesthesia Physical  Anesthesia Plan  ASA: I  Anesthesia Plan: General   Post-op Pain Management:    Induction: Intravenous  Airway Management Planned: LMA  Additional Equipment:   Intra-op Plan:   Post-operative Plan:   Informed Consent: I have reviewed the patients History and Physical, chart, labs and discussed the procedure including the risks, benefits and alternatives for the proposed anesthesia with the patient or authorized representative who has indicated his/her understanding and acceptance.     Plan Discussed with: CRNA, Anesthesiologist and Surgeon  Anesthesia Plan Comments:         Anesthesia Quick Evaluation

## 2014-12-24 NOTE — Anesthesia Procedure Notes (Signed)
Procedure Name: LMA Insertion Date/Time: 12/24/2014 11:08 AM Performed by: Zenia ResidesPAYNE, Isauro Skelley D Pre-anesthesia Checklist: Patient identified, Emergency Drugs available, Suction available and Patient being monitored Patient Re-evaluated:Patient Re-evaluated prior to inductionOxygen Delivery Method: Circle System Utilized Preoxygenation: Pre-oxygenation with 100% oxygen Intubation Type: IV induction Ventilation: Mask ventilation without difficulty LMA: LMA inserted LMA Size: 4.0 Number of attempts: 1 Airway Equipment and Method: Bite block Placement Confirmation: positive ETCO2 Tube secured with: Tape Dental Injury: Teeth and Oropharynx as per pre-operative assessment

## 2014-12-25 ENCOUNTER — Encounter (HOSPITAL_BASED_OUTPATIENT_CLINIC_OR_DEPARTMENT_OTHER): Payer: Self-pay | Admitting: Surgery

## 2014-12-25 NOTE — Anesthesia Postprocedure Evaluation (Signed)
Anesthesia Post Note  Patient: Anne ScotlandJustine [name]  Procedure(s) Performed: Procedure(s) (LRB): EXCISION UMBILICAL MASS (N/A)  Anesthesia type: General  Patient location: PACU  Post pain: Pain level controlled  Post assessment: Post-op Vital signs reviewed  Last Vitals: BP 130/90 mmHg  Pulse 69  Temp(Src) 36.7 C (Oral)  Resp 16  Ht 5' (1.524 m)  Wt 150 lb (68.04 kg)  BMI 29.30 kg/m2  SpO2 100%  Post vital signs: Reviewed  Level of consciousness: sedated  Complications: No apparent anesthesia complications

## 2014-12-26 NOTE — Op Note (Signed)
NAMLyna Poser:  Tumminello, Koleen             ACCOUNT NO.:  [id number]  MEDICAL RECORD NO.:  [id number]  LOCATION:                               FACILITY:  [facility]  PHYSICIAN:  Abigail Miyamotoouglas Jamael Hoffmann, M.D. DATE OF BIRTH:  1970-03-26  DATE OF PROCEDURE:  12/24/2014 DATE OF DISCHARGE:  12/24/2014                              OPERATIVE REPORT   POSTOPERATIVE DIAGNOSIS:  Umbilical mass.  POSTOPERATIVE DIAGNOSIS:  Umbilical mass.  PROCEDURE:  Excision of umbilical mass.  SURGEON:  Abigail Miyamotoouglas Arlana Canizales, M.D.  ANESTHESIA:  General and 0.5% Marcaine.  ESTIMATED BLOOD LOSS:  Minimal.  INDICATIONS:  This is a 44 year old female who has had previous endometriosis at the umbilicus, which has been excised in the past, it is now recurred.  She now presents with a hard mass again at her umbilicus.  Decision was made to proceed with excision of the mass as well as all of the umbilicus in attempt to prevent further recurrence. She has had intermittent infections of this endometriosis at the umbilicus as well.  PROCEDURE IN DETAIL:  The patient was brought to the operative room and identified as Anne [name].  She was placed supine on the operating room table and general anesthesia was induced.  Her abdomen was then prepped and draped in usual sterile fashion.  I anesthetized the skin around the umbilicus, the previous scar and palpable mass with Marcaine. I then made an elliptical incision with the scalpel around the previous scar and mass.  I took this down into the subcutaneous tissue with electrocautery.  It became apparent as I dissected down, the mass was attached to the fascia and I had to remove a small area of fascia as well entering the peritoneal cavity.  I then removed the mass completely and sent to Pathology for evaluation.  I then closed the fascial defect carefully with several figure-of-eight #1 Novafil sutures.  Good closure of the fascia appeared to be achieved.  I then anesthetized the  fascia and surrounding skin and subcutaneous tissue further with Marcaine. Hemostasis was appeared to be achieved with cautery.  I then closed the subcutaneous tissue with interrupted 3-0 Vicryl sutures and closed the skin with running 4-0 Monocryl.  Skin glue was then applied.  The patient tolerated the procedure well.  All the counts were correct at the end of the procedure.  The patient was then extubated in the operating room and taken in a stable condition to the recovery room.     Abigail Miyamotoouglas Carsyn Boster, M.D.     DB/MEDQ  D:  12/24/2014  T:  12/25/2014  Job:  [id number]

## 2015-01-04 ENCOUNTER — Encounter (HOSPITAL_COMMUNITY): Payer: Self-pay | Admitting: *Deleted

## 2015-01-04 ENCOUNTER — Emergency Department (HOSPITAL_COMMUNITY)
Admission: EM | Admit: 2015-01-04 | Discharge: 2015-01-04 | Disposition: A | Discharge status: Home / Self Care | Payer: Self-pay | Attending: Emergency Medicine | Admitting: Emergency Medicine

## 2015-01-04 DIAGNOSIS — R5383 Other fatigue: Secondary | ICD-10-CM | POA: Insufficient documentation

## 2015-01-04 DIAGNOSIS — R42 Dizziness and giddiness: Secondary | ICD-10-CM | POA: Insufficient documentation

## 2015-01-04 DIAGNOSIS — Y838 Other surgical procedures as the cause of abnormal reaction of the patient, or of later complication, without mention of misadventure at the time of the procedure: Secondary | ICD-10-CM | POA: Insufficient documentation

## 2015-01-04 DIAGNOSIS — R531 Weakness: Secondary | ICD-10-CM | POA: Insufficient documentation

## 2015-01-04 LAB — BASIC METABOLIC PANEL
ANION GAP: 12 (ref 5–15)
BUN: 11 mg/dL (ref 6–20)
CHLORIDE: 100 mmol/L — AB (ref 101–111)
CO2: 25 mmol/L (ref 22–32)
Calcium: 9.3 mg/dL (ref 8.9–10.3)
Creatinine, Ser: 0.69 mg/dL (ref 0.44–1.00)
GFR calc non Af Amer: >60 mL/min (ref >60)
Glucose, Bld: 101 mg/dL — ABNORMAL HIGH (ref 65–99)
POTASSIUM: 3.1 mmol/L — AB (ref 3.5–5.1)
SODIUM: 137 mmol/L (ref 135–145)

## 2015-01-04 LAB — CBC
HCT: 39.4 % (ref 36.0–46.0)
HEMOGLOBIN: 13.6 g/dL (ref 12.0–15.0)
MCH: 27.1 pg (ref 26.0–34.0)
MCHC: 34.5 g/dL (ref 30.0–36.0)
MCV: 78.6 fL (ref 78.0–100.0)
Platelets: 162 10*3/uL (ref 150–400)
RBC: 5.01 MIL/uL (ref 3.87–5.11)
RDW: 14 % (ref 11.5–15.5)
WBC: 5.7 10*3/uL (ref 4.0–10.5)

## 2015-01-04 NOTE — ED Notes (Signed)
Pt verbalized understanding of d/c instructions and has no further questions. Pt is to follow up with Dr Magnus IvanBlackman this week. Pt stable and NAD.

## 2015-01-04 NOTE — ED Notes (Signed)
The pt is still comfortable.  Waiting for a disposition

## 2015-01-04 NOTE — ED Notes (Signed)
The pt   Appears to be comfortable

## 2015-01-04 NOTE — ED Provider Notes (Signed)
CSN: [id number]     Arrival date & time 01/04/15  0208 History  By signing my name below, I, Freida Busman, attest that this documentation has been prepared under the direction and in the presence of Azalia Bilis, MD . Electronically Signed: Freida Busman, Scribe. 01/04/2015. 2:52 AM.  Chief Complaint  Patient presents with  . Post-op Problem   The history is provided by the patient. No language interpreter was used.    HPI Comments:  Anne [name] is a 44 y.o. female who presents to the Emergency Department s/p umbilical mass excision on 12/24/2014 complaining of mild bleeding from the incision site which she noticed last night.  She denies increased swelling from site. She also complains of mild dizziness, and fatigue; states she does not feel like herself.  She deneis fever, dysuria, increased pain at the surgical site,  recent nausea and vomiting. Pt notes normal PO intake. No alleviating factors noted. She has follow up appointment on Nov 30th, 2016  [facility]  Past Medical History  Diagnosis Date  . Complication of anesthesia     hard to wake up post-op  . 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 Abdominal hysterectomy      partial  . Mass excision N/A 08/21/2012    Procedure: Excision of umbilical cyst, removal of umbilicus;  Surgeon: Shelly Rubenstein, MD;  Location: [facility] OR;  Service: General;  Laterality: N/A;  . Incision and drainage abscess  09/02/2011    infected umbilical cyst  . Mass excision N/A 12/24/2014    Procedure: EXCISION UMBILICAL MASS;  Surgeon: Abigail Miyamoto, MD;  Location: [facility];  Service: General;  Laterality: N/A;  umbilical   History reviewed. No pertinent family history. Social History  Substance Use Topics  . Smoking status: Never Smoker   . Smokeless tobacco: Never Used  . Alcohol Use: Yes     Comment: occasionally   OB History    Gravida Para Term Preterm AB TAB SAB  Ectopic Multiple Living       Review of Systems  Constitutional: Positive for fatigue. Negative for appetite change.  Gastrointestinal: Negative for nausea and vomiting.  Skin: Positive for wound.  Neurological: Positive for dizziness.  All other systems reviewed and are negative.   Allergies  Oxycodone  Home Medications   Prior to Admission medications   Medication Sig Start Date End Date Taking? Authorizing Provider  HYDROcodone-acetaminophen (NORCO) 5-325 MG tablet Take 1-2 tablets by mouth every 4 (four) hours as needed for moderate pain. 12/24/14   Abigail Miyamoto, MD   BP 141/97 mmHg  Pulse 93  Temp(Src) 98.1 F (36.7 C) (Oral)  Resp 16  SpO2 98% Physical Exam  Constitutional: She is oriented to person, place, and time. She appears well-developed and well-nourished. No distress.  HENT:  Head: Normocephalic and atraumatic.  Eyes: EOM are normal.  Neck: Normal range of motion.  Cardiovascular: Normal rate, regular rhythm and normal heart sounds.   Pulmonary/Chest: Effort normal and breath sounds normal.  Abdominal: Soft. She exhibits no distension. There is no tenderness.  Healing periumbilical incision without erythema or warmth.  No focal tenderness.  Small amount of crusted blood noted near the distal third of the incision without active bleeding.  Musculoskeletal: Normal range of motion.  Neurological: She is alert and oriented to person, place, and time.  Skin: Skin is warm and dry.  Psychiatric: She  has a normal mood and affect. Judgment normal.  Nursing note and vitals reviewed.   ED Course  Procedures   DIAGNOSTIC STUDIES:  Oxygen Saturation is 98% on RA, normal by my interpretation.    COORDINATION OF CARE:  2:59 AM Discussed treatment plan with pt at bedside and pt agreed to plan.  Labs Review Labs Reviewed  BASIC METABOLIC PANEL - Abnormal; Notable for the following:    Potassium 3.1 (*)    Chloride 100 (*)    Glucose, Bld  101 (*)    All other components within normal limits  CBC    Imaging Review No results found. I have personally reviewed and evaluated these lab results as part of my medical decision-making.   EKG Interpretation None      MDM   Final diagnoses:  Weakness    Overall the patient is well-appearing.  Her vital signs are normal.  Her abdominal exam is benign.  Her surgical incision appears to be healing well.  Suspect however she does have some small underlying hematoma which is the cause of her bleeding from her incision.  No indication for CT imaging at this time.  She's been told to call her surgical team on Monday for close follow-up.  She understands return to the ER for new or worsening symptoms.  Unclear etiology of her generalized fatigue.  I recommend that she obtain a relationship with a primary care physician in follow-up.  Hemoglobin is normal.  Patient reports she's been eating and drinking normally.  No nausea or vomiting.  I personally performed the services described in this documentation, which was scribed in my presence. The recorded information has been reviewed and is accurate.       Azalia BilisKevin Shona Pardo, MD 01/04/15 [phone]

## 2015-01-04 NOTE — ED Notes (Signed)
Pt. Had a umbilical mass removed last week. Pt. States last night she noticed blood coming from her incision and started feeling weak. Pt. Stated she called her surgeon's office and they recommended she lie down. Pt states she tried to lay down but noticed more blood from the incision and feeling progressively weaker.

## 2015-05-06 ENCOUNTER — Other Ambulatory Visit: Payer: Self-pay | Admitting: Occupational Medicine

## 2015-05-06 ENCOUNTER — Ambulatory Visit: Payer: Self-pay

## 2015-05-06 DIAGNOSIS — M25532 Pain in left wrist: Secondary | ICD-10-CM

## 2016-05-18 ENCOUNTER — Ambulatory Visit (HOSPITAL_COMMUNITY)
Admission: EM | Admit: 2016-05-18 | Discharge: 2016-05-18 | Disposition: A | Discharge status: Home / Self Care | Payer: BLUE CROSS/BLUE SHIELD | Attending: Internal Medicine | Admitting: Internal Medicine

## 2016-05-18 ENCOUNTER — Encounter (HOSPITAL_COMMUNITY): Payer: Self-pay | Admitting: Emergency Medicine

## 2016-05-18 DIAGNOSIS — G44219 Episodic tension-type headache, not intractable: Secondary | ICD-10-CM

## 2016-05-18 DIAGNOSIS — I10 Essential (primary) hypertension: Secondary | ICD-10-CM

## 2016-05-18 MED ORDER — NAPROXEN 250 MG PO TABS: 250.0000 mg | ORAL_TABLET | Freq: Two times a day (BID) | ORAL | 0 refills | Status: DC

## 2016-05-18 MED ORDER — AMLODIPINE BESYLATE 5 MG PO TABS: 5.0000 mg | ORAL_TABLET | Freq: Every day | ORAL | 0 refills | Status: DC

## 2016-05-18 NOTE — ED Provider Notes (Signed)
CSN: [id number]     Arrival date & time 05/18/16  1110 History   First MD Initiated Contact with Patient 05/18/16 1217     Chief Complaint  Patient presents with  . Hypertension   (Consider location/radiation/quality/duration/timing/severity/associated sxs/prior Treatment) Patient c/o headache and elevated bp.  Patient has hx of HTN and is not taking medication because she ran out.  Patient c/o neck stiffness and tension headache for last couple days. She is worried her HTN is contributing.   The history is provided by the patient.  Hypertension  This is a new problem. The current episode started 2 days ago. The problem occurs constantly. The problem has not changed since onset.Associated symptoms include headaches. Nothing aggravates the symptoms. Nothing relieves the symptoms. She has tried nothing for the symptoms.    Past Medical History:  Diagnosis Date  . Complication of anesthesia    hard to wake up post-op  . Hypertension   . 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 ABDOMINAL HYSTERECTOMY     partial  . INCISION AND DRAINAGE ABSCESS  09/02/2011   infected umbilical cyst  . MASS EXCISION N/A 08/21/2012   Procedure: Excision of umbilical cyst, removal of umbilicus;  Surgeon: Shelly Rubensteinouglas A Blackman, MD;  [facility];  Service: General;  Laterality: N/A;  . MASS EXCISION N/A 12/24/2014   Procedure: EXCISION UMBILICAL MASS;  Surgeon: Abigail Miyamotoouglas Blackman, MD;  Location: [facility];  Service: General;  Laterality: N/A;  umbilical   No family history on file. Social History  Substance Use Topics  . Smoking status: Never Smoker  . Smokeless tobacco: Never Used  . Alcohol use Yes     Comment: occasionally   OB History    Gravida Para Term Preterm AB Living   0 0 0 0 0 0   SAB TAB Ectopic Multiple Live Births   0 0 0 0       Review of Systems  Constitutional: Negative.   HENT: Negative.   Eyes: Negative.    Respiratory: Negative.   Cardiovascular: Negative.   Gastrointestinal: Negative.   Endocrine: Negative.   Genitourinary: Negative.   Musculoskeletal: Negative.   Allergic/Immunologic: Negative.   Neurological: Positive for headaches.  Hematological: Negative.   Psychiatric/Behavioral: Negative.     Allergies  Oxycodone  Home Medications   Prior to Admission medications   Medication Sig Start Date End Date Taking? Authorizing Provider  amLODipine (NORVASC) 5 MG tablet Take 1 tablet (5 mg total) by mouth daily. 05/18/16   Deatra CanterWilliam J Oxford, FNP  HYDROcodone-acetaminophen (NORCO) 5-325 MG tablet Take 1-2 tablets by mouth every 4 (four) hours as needed for moderate pain. 12/24/14   Abigail Miyamotoouglas Blackman, MD  ibuprofen (ADVIL,MOTRIN) 200 MG tablet Take 200-400 mg by mouth every 6 (six) hours as needed for moderate pain.    Historical Provider, MD  naproxen (NAPROSYN) 250 MG tablet Take 1 tablet (250 mg total) by mouth 2 (two) times daily with a meal. 05/18/16   Deatra CanterWilliam J Oxford, FNP   Meds Ordered and Administered this Visit  Medications - No data to display  BP (!) 151/100   Pulse 79   Temp 97.6 F (36.4 C)   Resp 20   SpO2 100%  No data found.   Physical Exam  Constitutional: She is oriented to person, place, and time. She appears well-developed and well-nourished.  HENT:  Head: Normocephalic and atraumatic.  Right Ear: External ear normal.  Left Ear:  External ear normal.  Eyes: Conjunctivae and EOM are normal. Pupils are equal, round, and reactive to light.  Neck: Normal range of motion. Neck supple.  Cardiovascular: Normal rate, regular rhythm and normal heart sounds.   Pulmonary/Chest: Effort normal and breath sounds normal.  Abdominal: Soft. Bowel sounds are normal.  Musculoskeletal: Normal range of motion.  Neurological: She is alert and oriented to person, place, and time.  Nursing note and vitals reviewed.   Urgent Care Course     Procedures (including critical  care time)  Labs Review Labs Reviewed - No data to display  Imaging Review No results found.   Visual Acuity Review  Right Eye Distance:   Left Eye Distance:   Bilateral Distance:    Right Eye Near:   Left Eye Near:    Bilateral Near:         MDM   1. Episodic tension-type headache, not intractable   2. Hypertension, unspecified type    Amlodipine 5mg  one po qd #30 Naprosyn 250mg  one po bid x 10 days #20  Referral to PCP      Deatra Canter, FNP 05/18/16 1243

## 2016-05-18 NOTE — Discharge Instructions (Signed)
Please call and make an appointment to see Dr. Duanne Guessewey who does primary care.  Will need  a PCP for hypertensive medication refill.

## 2016-05-18 NOTE — ED Triage Notes (Signed)
Seen  by provider only

## 2016-10-20 ENCOUNTER — Ambulatory Visit (HOSPITAL_COMMUNITY)
Admission: EM | Admit: 2016-10-20 | Discharge: 2016-10-20 | Disposition: A | Discharge status: Home / Self Care | Payer: BLUE CROSS/BLUE SHIELD | Attending: Family Medicine | Admitting: Family Medicine

## 2016-10-20 ENCOUNTER — Encounter (HOSPITAL_COMMUNITY): Payer: Self-pay | Admitting: Family Medicine

## 2016-10-20 DIAGNOSIS — M791 Myalgia: Secondary | ICD-10-CM | POA: Diagnosis not present

## 2016-10-20 DIAGNOSIS — M25512 Pain in left shoulder: Secondary | ICD-10-CM | POA: Diagnosis not present

## 2016-10-20 DIAGNOSIS — M7918 Myalgia, other site: Secondary | ICD-10-CM

## 2016-10-20 MED ORDER — CYCLOBENZAPRINE HCL 10 MG PO TABS: 10.0000 mg | ORAL_TABLET | Freq: Two times a day (BID) | ORAL | 0 refills | Status: DC | PRN

## 2016-10-20 MED ORDER — DICLOFENAC SODIUM 75 MG PO TBEC: 75.0000 mg | DELAYED_RELEASE_TABLET | Freq: Two times a day (BID) | ORAL | 0 refills | Status: DC
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
## 2017-03-07 ENCOUNTER — Encounter (HOSPITAL_COMMUNITY): Payer: Self-pay | Admitting: Emergency Medicine

## 2017-03-07 ENCOUNTER — Emergency Department (HOSPITAL_COMMUNITY)
Admission: EM | Admit: 2017-03-07 | Discharge: 2017-03-08 | Disposition: A | Discharge status: Home / Self Care | Payer: BLUE CROSS/BLUE SHIELD | Attending: Emergency Medicine | Admitting: Emergency Medicine

## 2017-03-07 DIAGNOSIS — I10 Essential (primary) hypertension: Secondary | ICD-10-CM | POA: Diagnosis not present

## 2017-03-07 DIAGNOSIS — R1011 Right upper quadrant pain: Secondary | ICD-10-CM | POA: Insufficient documentation

## 2017-03-07 DIAGNOSIS — Z79899 Other long term (current) drug therapy: Secondary | ICD-10-CM | POA: Insufficient documentation

## 2017-03-07 DIAGNOSIS — R109 Unspecified abdominal pain: Secondary | ICD-10-CM

## 2017-03-07 LAB — CBC WITH DIFFERENTIAL/PLATELET
BASOS ABS: 0 10*3/uL (ref 0.0–0.1)
Basophils Relative: 0 %
EOS ABS: 0.1 10*3/uL (ref 0.0–0.7)
EOS PCT: 1 %
HCT: 37.9 % (ref 36.0–46.0)
Hemoglobin: 13.3 g/dL (ref 12.0–15.0)
LYMPHS PCT: 41 %
Lymphs Abs: 3 10*3/uL (ref 0.7–4.0)
MCH: 27.7 pg (ref 26.0–34.0)
MCHC: 35.1 g/dL (ref 30.0–36.0)
MCV: 78.8 fL (ref 78.0–100.0)
Monocytes Absolute: 0.4 10*3/uL (ref 0.1–1.0)
Monocytes Relative: 5 %
Neutro Abs: 3.9 10*3/uL (ref 1.7–7.7)
Neutrophils Relative %: 53 %
PLATELETS: 149 10*3/uL — AB (ref 150–400)
RBC: 4.81 MIL/uL (ref 3.87–5.11)
RDW: 14.6 % (ref 11.5–15.5)
WBC: 7.4 10*3/uL (ref 4.0–10.5)

## 2017-03-07 LAB — COMPREHENSIVE METABOLIC PANEL
ALBUMIN: 4 g/dL (ref 3.5–5.0)
ALT: 21 U/L (ref 14–54)
AST: 21 U/L (ref 15–41)
Alkaline Phosphatase: 68 U/L (ref 38–126)
Anion gap: 6 (ref 5–15)
BUN: 12 mg/dL (ref 6–20)
CHLORIDE: 107 mmol/L (ref 101–111)
CO2: 27 mmol/L (ref 22–32)
CREATININE: 0.63 mg/dL (ref 0.44–1.00)
Calcium: 8.7 mg/dL — ABNORMAL LOW (ref 8.9–10.3)
GFR calc Af Amer: >60 mL/min (ref >60)
GFR calc non Af Amer: >60 mL/min (ref >60)
Glucose, Bld: 90 mg/dL (ref 65–99)
Potassium: 3.3 mmol/L — ABNORMAL LOW (ref 3.5–5.1)
SODIUM: 140 mmol/L (ref 135–145)
Total Bilirubin: 0.8 mg/dL (ref 0.3–1.2)
Total Protein: 8.1 g/dL (ref 6.5–8.1)

## 2017-03-07 LAB — URINALYSIS, ROUTINE W REFLEX MICROSCOPIC
Bilirubin Urine: NEGATIVE
Glucose, UA: NEGATIVE mg/dL
Ketones, ur: 5 mg/dL — AB
Leukocytes, UA: NEGATIVE
Nitrite: NEGATIVE
PROTEIN: NEGATIVE mg/dL
Specific Gravity, Urine: 1.014 (ref 1.005–1.030)
pH: 6 (ref 5.0–8.0)

## 2017-03-07 LAB — LIPASE, BLOOD: Lipase: 33 U/L (ref 11–51)

## 2017-03-07 NOTE — ED Provider Notes (Signed)
Patient placed in Quick Look pathway, seen and evaluated   Chief Complaint: right upper quadrant Abdominal pain  HPI:   Pain in right side for 1 day  ROS:   Physical Exam:   Gen: No distress  Neuro: Awake and Alert  Skin: Warm    Focused Exam: tender right upper quadrant   Initiation of care has begun. The patient has been counseled on the process, plan, and necessity for staying for the completion/evaluation, and the remainder of the medical screening examination   Osie CheeksSofia, Leslie K, PA-C 03/07/17 Shirlean [id number]    Butler, Michael C, MD 03/08/17 [phone]

## 2017-03-07 NOTE — ED Notes (Signed)
Attempted blood draw, was unsuccessful  

## 2017-03-07 NOTE — ED Notes (Signed)
Per PCP-sending patient here for RLQ pain that started 24 hours ago-no fever and no rebound tenderness

## 2017-03-07 NOTE — ED Triage Notes (Signed)
Patient c/o RLQ pain that radiates to back and kid lower abd area since last night. Denies any n/v/d or urinary problems.

## 2017-03-08 ENCOUNTER — Encounter (HOSPITAL_COMMUNITY): Payer: Self-pay

## 2017-03-08 ENCOUNTER — Emergency Department (HOSPITAL_COMMUNITY): Payer: BLUE CROSS/BLUE SHIELD

## 2017-03-08 MED ORDER — ONDANSETRON HCL 4 MG/2ML IJ SOLN
4.0000 mg | Freq: Once | INTRAMUSCULAR | Status: AC
  Administered 2017-03-08: 4 mg via INTRAVENOUS
  Filled 2017-03-08: qty 2

## 2017-03-08 MED ORDER — HYDROCODONE-ACETAMINOPHEN 5-325 MG PO TABS: 1.0000 | ORAL_TABLET | Freq: Four times a day (QID) | ORAL | 0 refills | Status: AC | PRN

## 2017-03-08 MED ORDER — MORPHINE SULFATE (PF) 4 MG/ML IV SOLN
4.0000 mg | Freq: Once | INTRAVENOUS | Status: AC
  Administered 2017-03-08: 4 mg via INTRAVENOUS
  Filled 2017-03-08: qty 1

## 2017-03-08 NOTE — Discharge Instructions (Signed)
You were seen today for right flank pain.  Your workup is reassuring.  If you have worsening pain, fevers, develop nausea, vomiting, or diarrhea, you need to be reevaluated.

## 2017-03-08 NOTE — ED Notes (Signed)
Provided patient cranberry juice.

## 2017-03-08 NOTE — ED Provider Notes (Signed)
[facility]-EMERGENCY DEPT Provider Note   CSN: [id number] Arrival date & time: 03/07/17  1810     History   Chief Complaint Chief Complaint  Patient presents with  . Abdominal Pain  . Flank Pain    HPI Anne [name] is a 47 y.o. female.  HPI  This is a 47 year old female with a history of hypertension who presents with right flank pain.  Patient reports 1 day history of right dull flank pain.  Comes and goes.  Not worsened with eating.  No exacerbating or alleviating factors.  Denies any dysuria or hematuria.  Denies any nausea, vomiting, diarrhea or change in bowel movements.  She states the pain radiates downward and across her lower abdomen.  History of endometriosis with surgery.  Currently rates her pain at 7 out of 10.  Past Medical History:  Diagnosis Date  . Complication of anesthesia    hard to wake up post-op  . Hypertension   . 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 Unspecified vitamin D deficiency 09/19/2013  . Language barrier, speaks [place] only 11/16/2012  . Endometriosis, umbilicus 11/15/2012  . Abscess or cellulitis of umbilicus 06/21/2012  . Umbilical hernia 01/03/2012  . Mass of abdomen 02/12/2011  . S/P total hysterectomy 01/28/2011  . Recurrent left ovarian complex cyst 01/08/2011  . Hypertension 01/08/2011    Past Surgical History:  Procedure Laterality Date  . ABDOMINAL HYSTERECTOMY     partial  . INCISION AND DRAINAGE ABSCESS  09/02/2011   infected umbilical cyst  . MASS EXCISION N/A 08/21/2012   Procedure: Excision of umbilical cyst, removal of umbilicus;  Surgeon: Shelly Rubensteinouglas A Blackman, MD;  [facility];  Service: General;  Laterality: N/A;  . MASS EXCISION N/A 12/24/2014   Procedure: EXCISION UMBILICAL MASS;  Surgeon: Abigail Miyamotoouglas Blackman, MD;  Location: [facility];  Service: General;  Laterality: N/A;  umbilical    OB History    Gravida Para Term  Preterm AB Living   0 0 0 0 0 0   SAB TAB Ectopic Multiple Live Births   0 0 0 0         Home Medications    Prior to Admission medications   Medication Sig Start Date End Date Taking? Authorizing Provider  acetaminophen (TYLENOL) 500 MG tablet Take 1,000 mg by mouth every 6 (six) hours as needed for mild pain, moderate pain or headache.   Yes [provider]  amLODipine (NORVASC) 5 MG tablet Take 1 tablet (5 mg total) by mouth daily. Patient not taking: Reported on 03/08/2017 05/18/16   Deatra Canterxford, William J, FNP  cyclobenzaprine (FLEXERIL) 10 MG tablet Take 1 tablet (10 mg total) by mouth 2 (two) times daily as needed for muscle spasms. Patient not taking: Reported on 03/08/2017 10/20/16   Dorena BodoKennard, Lawrence, NP  diclofenac (VOLTAREN) 75 MG EC tablet Take 1 tablet (75 mg total) by mouth 2 (two) times daily. Patient not taking: Reported on 03/08/2017 10/20/16   Dorena BodoKennard, Lawrence, NP  HYDROcodone-acetaminophen (NORCO/VICODIN) 5-325 MG tablet Take 1 tablet by mouth every 6 (six) hours as needed. 03/08/17   Brenden Rudman, Mayer Maskerourtney F, MD    Family History No family history on file.  Social History Social History   Tobacco Use  . Smoking status: Never Smoker  . Smokeless tobacco: Never Used  Substance Use Topics  . Alcohol use: Yes    Comment: occasionally  . Drug use: No  Allergies   Oxycodone   Review of Systems Review of Systems  Constitutional: Negative for fever.  Respiratory: Negative for shortness of breath.   Cardiovascular: Negative for chest pain.  Gastrointestinal: Positive for abdominal pain. Negative for diarrhea, nausea and vomiting.  Genitourinary: Positive for flank pain. Negative for dysuria and hematuria.  All other systems reviewed and are negative.    Physical Exam Updated Vital Signs BP 137/90 (BP Location: Right Arm)   Pulse 73   Temp 98.1 F (36.7 C) (Oral)   Resp 16   Ht 5' (1.524 m)   Wt 68.5 kg (151 lb)   SpO2 100%   BMI 29.49 kg/m    Physical Exam  Constitutional: She is oriented to person, place, and time. She appears well-developed and well-nourished.  Overweight, no acute distress  HENT:  Head: Normocephalic and atraumatic.  Neck: Neck supple.  Cardiovascular: Normal rate, regular rhythm and normal heart sounds.  Pulmonary/Chest: Effort normal. No respiratory distress. She has no wheezes.  Abdominal: Soft. Bowel sounds are normal. There is tenderness in the right upper quadrant. There is no CVA tenderness.  Minimal tenderness palpation right upper quadrant  Neurological: She is alert and oriented to person, place, and time.  Skin: Skin is warm and dry.  Psychiatric: She has a normal mood and affect.  Nursing note and vitals reviewed.    ED Treatments / Results  Labs (all labs ordered are listed, but only abnormal results are displayed) Labs Reviewed  CBC WITH DIFFERENTIAL/PLATELET - Abnormal; Notable for the following components:      Result Value   Platelets 149 (*)    All other components within normal limits  COMPREHENSIVE METABOLIC PANEL - Abnormal; Notable for the following components:   Potassium 3.3 (*)    Calcium 8.7 (*)    All other components within normal limits  URINALYSIS, ROUTINE W REFLEX MICROSCOPIC - Abnormal; Notable for the following components:   Hgb urine dipstick SMALL (*)    Ketones, ur 5 (*)    Bacteria, UA RARE (*)    Squamous Epithelial / LPF 0-5 (*)    All other components within normal limits  LIPASE, BLOOD    EKG  EKG Interpretation None       Radiology Ct Renal Stone Study  Result Date: 03/08/2017 CLINICAL DATA:  47 year old female with right lower quadrant pain. Microhematuria. EXAM: CT ABDOMEN AND PELVIS WITHOUT CONTRAST TECHNIQUE: Multidetector CT imaging of the abdomen and pelvis was performed following the standard protocol without IV contrast. COMPARISON:  Abdominal CT dated 11/12/2014. FINDINGS: Evaluation of this exam is limited in the absence of  intravenous contrast. Lower chest: Minimal right lung base atelectatic changes. The visualized lung bases are otherwise clear. No intra-free air or free fluid. Hepatobiliary: Probable mild fatty infiltration of the liver. No intrahepatic biliary ductal dilatation. The gallbladder is unremarkable. Pancreas: Unremarkable. No pancreatic ductal dilatation or surrounding inflammatory changes. Spleen: Normal in size without focal abnormality. Adrenals/Urinary Tract: The adrenal glands are unremarkable. There is no hydronephrosis or nephrolithiasis on either side. The visualized ureters and urinary bladder appear unremarkable. There is tilt of the bladder to the left anterior hemipelvis similar to prior CT most likely representing adhesions. Stomach/Bowel: There is moderate stool throughout the colon. No bowel obstruction or active inflammation. Normal appendix. Vascular/Lymphatic: The abdominal aorta and IVC are grossly unremarkable on this noncontrast CT. No portal venous gas. There is no adenopathy. Top-normal lymph node anterior to the IVC measures 10 mm (previously 8 mm) in  short axis, of indeterminate clinical significance or etiology, likely reactive. Reproductive: Hysterectomy. There is slight prominence of the left ovary similar to prior CT. There is abutment of the left ovary to the left bladder wall likely representing adhesions. The right ovary is not visualized with certainty. Other: Midline vertical anterior pelvic wall incisional scar. No fluid collection. Musculoskeletal: No acute or significant osseous findings. IMPRESSION: 1. No acute intra-abdominal or pelvic pathology. No hydronephrosis or nephrolithiasis. 2. No bowel obstruction or active inflammation.  Normal appendix. 3. Postsurgical changes of hysterectomy. The right ovary is not visualized. Similar appearance of the left ovary with findings of probable adhesion to the left bladder wall. 4. Midline anterior pelvic wall incisional scar and  postsurgical scarring of the left anterior pelvic wall. No fluid collection. Electronically Signed   By: Elgie Collard M.D.   On: 03/08/2017 01:41    Procedures Procedures (including critical care time)  Medications Ordered in ED Medications  morphine 4 MG/ML injection 4 mg (4 mg Intravenous Given 03/08/17 0054)  ondansetron (ZOFRAN) injection 4 mg (4 mg Intravenous Given 03/08/17 0054)     Initial Impression / Assessment and Plan / ED Course  I have reviewed the triage vital signs and the nursing notes.  Pertinent labs & imaging results that were available during my care of the patient were reviewed by me and considered in my medical decision making (see chart for details).     Patient presents with right upper quadrant and right flank pain that radiates into the lower abdomen.  She is overall nontoxic appearing.  Vital signs are reassuring.  Lab work is largely unremarkable.  Exam is notable for extensive abdominal scarring but no significant tenderness.  May be some mild right upper quadrant tenderness.  No postprandial pain.  Differential includes kidney stone, gallbladder pathology, obstructive process.  Patient was given pain medication.  CT scan obtained for the most comprehensive evaluation.  CT scan is unremarkable.  Patient improved with pain medication.  Etiology of discomfort unclear at this time; however, workup is reassuring.  After history, exam, and medical workup I feel the patient has been appropriately medically screened and is safe for discharge home. Pertinent diagnoses were discussed with the patient. Patient was given return precautions.   Final Clinical Impressions(s) / ED Diagnoses   Final diagnoses:  Right flank pain    ED Discharge Orders        Ordered    HYDROcodone-acetaminophen (NORCO/VICODIN) 5-325 MG tablet  Every 6 hours PRN     03/08/17 0206       Shon Baton, MD 03/08/17 [phone]

## 2020-06-03 ENCOUNTER — Other Ambulatory Visit: Payer: Self-pay

## 2020-06-03 ENCOUNTER — Encounter (HOSPITAL_COMMUNITY): Payer: Self-pay

## 2020-06-03 ENCOUNTER — Ambulatory Visit (HOSPITAL_COMMUNITY)
Admission: EM | Admit: 2020-06-03 | Discharge: 2020-06-03 | Disposition: A | Discharge status: Home / Self Care | Payer: BLUE CROSS/BLUE SHIELD | Attending: Student | Admitting: Student

## 2020-06-03 DIAGNOSIS — S060X0A Concussion without loss of consciousness, initial encounter: Secondary | ICD-10-CM

## 2020-06-03 DIAGNOSIS — I10 Essential (primary) hypertension: Secondary | ICD-10-CM

## 2020-06-03 DIAGNOSIS — S161XXA Strain of muscle, fascia and tendon at neck level, initial encounter: Secondary | ICD-10-CM

## 2020-06-03 MED ORDER — TIZANIDINE HCL 2 MG PO CAPS: 2.0000 mg | ORAL_CAPSULE | Freq: Three times a day (TID) | ORAL | 0 refills | Status: AC
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
## 2020-06-03 NOTE — Discharge Instructions (Addendum)
-  Start the muscle relaxer-Zanaflex (tizanidine), up to 3 times daily for muscle spasms and pain.  This can make you drowsy, so take at bedtime or when you do not need to drive or operate machinery. -Take Tylenol 1000 mg 3 times daily, and ibuprofen 800 mg 3 times daily with food.  You can take these together, or alternate every 3-4 hours. -Try to avoid using screens like computers and phones for the next few days. -Go straight to ER (or call 911) if you suddenly develop the worst headache of your life, especially if this is associated with vision changes, loss of consciousness, weakness/sensation changes in one arm or one leg, shortness of breath, chest pain. Also seek immediate medical attention if you develop worsening of abdominal pain, changes in her bowel or bladder function. -Get help right away if: You have: A very bad headache that is not helped by medicine. Trouble walking or weakness in your arms and legs. Clear or bloody fluid coming from your nose or ears. Changes in how you see (vision). A seizure. More confusion or more grumpy moods. Your symptoms get worse. You are sleepier than normal and have trouble staying awake. You lose your balance. The black centers of your eyes (pupils) change in size. Your speech is slurred. Your dizziness gets worse. You vomit.
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
## 2022-09-04 ENCOUNTER — Emergency Department (HOSPITAL_COMMUNITY): Payer: BLUE CROSS/BLUE SHIELD

## 2022-09-04 ENCOUNTER — Emergency Department (HOSPITAL_COMMUNITY)
Admission: EM | Admit: 2022-09-04 | Discharge: 2022-09-05 | Disposition: A | Discharge status: Home / Self Care | Payer: BLUE CROSS/BLUE SHIELD | Attending: Emergency Medicine | Admitting: Emergency Medicine

## 2022-09-04 ENCOUNTER — Other Ambulatory Visit: Payer: Self-pay

## 2022-09-04 DIAGNOSIS — E876 Hypokalemia: Secondary | ICD-10-CM | POA: Diagnosis not present

## 2022-09-04 DIAGNOSIS — I159 Secondary hypertension, unspecified: Secondary | ICD-10-CM | POA: Insufficient documentation

## 2022-09-04 DIAGNOSIS — R519 Headache, unspecified: Secondary | ICD-10-CM

## 2022-09-04 DIAGNOSIS — Z79899 Other long term (current) drug therapy: Secondary | ICD-10-CM | POA: Diagnosis not present

## 2022-09-04 LAB — BASIC METABOLIC PANEL
Anion gap: 7 (ref 5–15)
BUN: 9 mg/dL (ref 6–20)
CO2: 27 mmol/L (ref 22–32)
Calcium: 9 mg/dL (ref 8.9–10.3)
Chloride: 103 mmol/L (ref 98–111)
Creatinine, Ser: 0.64 mg/dL (ref 0.44–1.00)
GFR, Estimated: >60 mL/min (ref >60)
Glucose, Bld: 104 mg/dL — ABNORMAL HIGH (ref 70–99)
Potassium: 2.8 mmol/L — ABNORMAL LOW (ref 3.5–5.1)
Sodium: 137 mmol/L (ref 135–145)

## 2022-09-04 LAB — CBC
HCT: 39.5 % (ref 36.0–46.0)
Hemoglobin: 13.2 g/dL (ref 12.0–15.0)
MCH: 26.6 pg (ref 26.0–34.0)
MCHC: 33.4 g/dL (ref 30.0–36.0)
MCV: 79.6 fL — ABNORMAL LOW (ref 80.0–100.0)
Platelets: 189 10*3/uL (ref 150–400)
RBC: 4.96 MIL/uL (ref 3.87–5.11)
RDW: 15.1 % (ref 11.5–15.5)
WBC: 8.4 10*3/uL (ref 4.0–10.5)
nRBC: 0 % (ref 0.0–0.2)

## 2022-09-04 LAB — CBG MONITORING, ED: Glucose-Capillary: 101 mg/dL — ABNORMAL HIGH (ref 70–99)

## 2022-09-04 MED ORDER — SODIUM CHLORIDE 0.9 % IV BOLUS
1000.0000 mL | Freq: Once | INTRAVENOUS | Status: AC
  Administered 2022-09-05: 1000 mL via INTRAVENOUS

## 2022-09-04 MED ORDER — METOCLOPRAMIDE HCL 5 MG/ML IJ SOLN
10.0000 mg | Freq: Once | INTRAMUSCULAR | Status: AC
  Administered 2022-09-05: 10 mg via INTRAVENOUS
  Filled 2022-09-04: qty 2

## 2022-09-04 MED ORDER — KETOROLAC TROMETHAMINE 15 MG/ML IJ SOLN
15.0000 mg | Freq: Once | INTRAMUSCULAR | Status: AC
  Administered 2022-09-05: 15 mg via INTRAVENOUS
  Filled 2022-09-04: qty 1

## 2022-09-04 MED ORDER — SODIUM CHLORIDE 0.9 % IV SOLN: INTRAVENOUS | Status: DC

## 2022-09-04 NOTE — ED Provider Notes (Signed)
  [facility] AT [facility] Provider Note   CSN: [id number] Arrival date & time: 09/04/22  2126     History {Add pertinent medical, surgical, social history, OB history to HPI:1} Chief Complaint  Patient presents with  . Headache    Anne [name] is a 52 y.o. female. 8/10HA, dizzy with movement, no amlodipine x 2 months. N/V no vision changes, CP, SOB, LOC.   HPI     Home Medications Prior to Admission medications   Medication Sig Start Date End Date Taking? Authorizing Provider  acetaminophen (TYLENOL) 500 MG tablet Take 1,000 mg by mouth every 6 (six) hours as needed for mild pain, moderate pain or headache.    [provider]  amLODipine (NORVASC) 5 MG tablet Take 1 tablet (5 mg total) by mouth daily. Patient not taking: Reported on 03/08/2017 05/18/16   Deatra Canter, FNP  HYDROcodone-acetaminophen (NORCO/VICODIN) 5-325 MG tablet Take 1 tablet by mouth every 6 (six) hours as needed. 03/08/17   Horton, Mayer Masker, MD  tizanidine (ZANAFLEX) 2 MG capsule Take 1 capsule (2 mg total) by mouth 3 (three) times daily. 06/03/20   Rhys Martini, PA-C      Allergies    Oxycodone    Review of Systems   Review of Systems  Physical Exam Updated Vital Signs There were no vitals taken for this visit. Physical Exam  ED Results / Procedures / Treatments   Labs (all labs ordered are listed, but only abnormal results are displayed) Labs Reviewed  CBC - Abnormal; Notable for the following components:      Result Value   MCV 79.6 (*)    All other components within normal limits  CBG MONITORING, ED - Abnormal; Notable for the following components:   Glucose-Capillary 101 (*)    All other components within normal limits  BASIC METABOLIC PANEL  URINALYSIS, ROUTINE W REFLEX MICROSCOPIC    EKG None  Radiology No results found.  Procedures Procedures  {Document cardiac monitor, telemetry assessment procedure when  appropriate:1}  Medications Ordered in ED Medications - No data to display  ED Course/ Medical Decision Making/ A&P   {   Click here for ABCD2, HEART and other calculatorsREFRESH Note before signing :1}                          Medical Decision Making  ***  {Document critical care time when appropriate:1} {Document review of labs and clinical decision tools ie heart score, Chads2Vasc2 etc:1}  {Document your independent review of radiology images, and any outside records:1} {Document your discussion with family members, caretakers, and with consultants:1} {Document social determinants of health affecting pt's care:1} {Document your decision making why or why not admission, treatments were needed:1} Final Clinical Impression(s) / ED Diagnoses Final diagnoses:  None    Rx / DC Orders ED Discharge Orders     None

## 2022-09-04 NOTE — ED Triage Notes (Signed)
Pt. Arrives POV c/o headache and dizziness all day. Pt. Also states when she checked her blood pressure earlier, it was a little high. Pt. States that she vomited twice today as well. She denies abdominal pain.

## 2022-09-05 LAB — URINALYSIS, ROUTINE W REFLEX MICROSCOPIC
Bilirubin Urine: NEGATIVE
Glucose, UA: NEGATIVE mg/dL
Hgb urine dipstick: NEGATIVE
Ketones, ur: NEGATIVE mg/dL
Leukocytes,Ua: NEGATIVE
Nitrite: NEGATIVE
Protein, ur: NEGATIVE mg/dL
Specific Gravity, Urine: 1.01 (ref 1.005–1.030)
pH: 7 (ref 5.0–8.0)

## 2022-09-05 LAB — HEPATIC FUNCTION PANEL
ALT: 31 U/L (ref 0–44)
AST: 23 U/L (ref 15–41)
Albumin: 3.7 g/dL (ref 3.5–5.0)
Alkaline Phosphatase: 68 U/L (ref 38–126)
Bilirubin, Direct: 0.1 mg/dL (ref 0.0–0.2)
Indirect Bilirubin: 0.4 mg/dL (ref 0.3–0.9)
Total Bilirubin: 0.5 mg/dL (ref 0.3–1.2)
Total Protein: 7.7 g/dL (ref 6.5–8.1)

## 2022-09-05 LAB — TROPONIN I (HIGH SENSITIVITY): Troponin I (High Sensitivity): 4 ng/L (ref <18)

## 2022-09-05 MED ORDER — AMLODIPINE BESYLATE 5 MG PO TABS
5.0000 mg | ORAL_TABLET | Freq: Once | ORAL | Status: AC
  Administered 2022-09-05: 5 mg via ORAL
  Filled 2022-09-05: qty 1

## 2022-09-05 MED ORDER — POTASSIUM CHLORIDE CRYS ER 20 MEQ PO TBCR
40.0000 meq | EXTENDED_RELEASE_TABLET | Freq: Once | ORAL | Status: AC
  Administered 2022-09-05: 40 meq via ORAL
  Filled 2022-09-05: qty 2

## 2022-09-05 MED ORDER — AMLODIPINE BESYLATE 5 MG PO TABS: 5.0000 mg | ORAL_TABLET | Freq: Every day | ORAL | 1 refills | Status: AC

## 2022-09-05 MED ORDER — POTASSIUM CHLORIDE 10 MEQ/100ML IV SOLN
10.0000 meq | INTRAVENOUS | Status: AC
  Administered 2022-09-05 (×3): 10 meq via INTRAVENOUS
  Filled 2022-09-05 (×3): qty 100

## 2022-09-05 MED ORDER — POTASSIUM CHLORIDE CRYS ER 20 MEQ PO TBCR: 20.0000 meq | EXTENDED_RELEASE_TABLET | Freq: Two times a day (BID) | ORAL | 0 refills | Status: AC

## 2022-09-05 NOTE — Discharge Instructions (Signed)
You are seen in the ER today for your headache and nausea.  These were improved after medications in the ER.  While the exact cause of your headache remains unclear does not appear to be anything dangerous at this time.  Your blood pressure was quite elevated.  Please resume taking your blood pressure medication as prescribed.  Return to the ER with any new severe symptoms.

## 2023-07-29 DIAGNOSIS — R2241 Localized swelling, mass and lump, right lower limb: Secondary | ICD-10-CM | POA: Diagnosis not present

## 2023-07-29 DIAGNOSIS — M545 Low back pain, unspecified: Secondary | ICD-10-CM | POA: Diagnosis not present

## 2023-08-17 DIAGNOSIS — E669 Obesity, unspecified: Secondary | ICD-10-CM | POA: Diagnosis not present

## 2023-08-17 DIAGNOSIS — I10 Essential (primary) hypertension: Secondary | ICD-10-CM | POA: Diagnosis not present

## 2023-08-17 DIAGNOSIS — E559 Vitamin D deficiency, unspecified: Secondary | ICD-10-CM | POA: Diagnosis not present

## 2023-08-17 DIAGNOSIS — Z0000 Encounter for general adult medical examination without abnormal findings: Secondary | ICD-10-CM | POA: Diagnosis not present

## 2023-08-17 DIAGNOSIS — Z1231 Encounter for screening mammogram for malignant neoplasm of breast: Secondary | ICD-10-CM | POA: Diagnosis not present

## 2023-08-17 DIAGNOSIS — Z23 Encounter for immunization: Secondary | ICD-10-CM | POA: Diagnosis not present

## 2023-09-13 DIAGNOSIS — Z1231 Encounter for screening mammogram for malignant neoplasm of breast: Secondary | ICD-10-CM | POA: Diagnosis not present

## 2024-01-26 ENCOUNTER — Other Ambulatory Visit (HOSPITAL_COMMUNITY): Payer: Self-pay

## 2024-01-26 MED ORDER — AMLODIPINE BESYLATE 5 MG PO TABS
5.0000 mg | ORAL_TABLET | Freq: Every day | ORAL | 0 refills | Status: DC
  Filled 2024-01-26: qty 30, 30d supply, fill #0

## 2024-02-24 ENCOUNTER — Other Ambulatory Visit (HOSPITAL_COMMUNITY): Payer: Self-pay

## 2024-02-27 ENCOUNTER — Other Ambulatory Visit (HOSPITAL_COMMUNITY): Payer: Self-pay

## 2024-02-27 MED ORDER — AMLODIPINE BESYLATE 5 MG PO TABS
5.0000 mg | ORAL_TABLET | Freq: Every day | ORAL | 0 refills | Status: DC
  Filled 2024-02-27: qty 90, 90d supply, fill #0
